# Patient Record
Sex: Male | Born: 1995 | Race: Black or African American | Hispanic: No | Marital: Single | Smoking: Current some day smoker
Health system: Southern US, Community
[De-identification: ages and names within clinical notes are randomized; demographics above are authoritative.]

## PROBLEM LIST (undated history)

## (undated) ENCOUNTER — Ambulatory Visit: Source: Home / Self Care

## (undated) DIAGNOSIS — T7840XA Allergy, unspecified, initial encounter: Secondary | ICD-10-CM

---
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
## 2016-02-02 ENCOUNTER — Other Ambulatory Visit (HOSPITAL_COMMUNITY)
Admission: RE | Admit: 2016-02-02 | Discharge: 2016-02-02 | Disposition: A | Discharge status: Home / Self Care | Payer: Medicaid Other | Source: Ambulatory Visit | Attending: Family Medicine | Admitting: Family Medicine

## 2016-02-02 ENCOUNTER — Encounter: Payer: Self-pay | Admitting: Family Medicine

## 2016-02-02 ENCOUNTER — Ambulatory Visit (INDEPENDENT_AMBULATORY_CARE_PROVIDER_SITE_OTHER): Payer: Medicaid Other | Admitting: Family Medicine

## 2016-02-02 VITALS — BP 108/56 | HR 60 | Temp 98.2°F | Wt 185.9 lb

## 2016-02-02 DIAGNOSIS — Z202 Contact with and (suspected) exposure to infections with a predominantly sexual mode of transmission: Secondary | ICD-10-CM | POA: Diagnosis not present

## 2016-02-02 DIAGNOSIS — R300 Dysuria: Secondary | ICD-10-CM

## 2016-02-02 DIAGNOSIS — Z113 Encounter for screening for infections with a predominantly sexual mode of transmission: Secondary | ICD-10-CM | POA: Diagnosis present

## 2016-02-02 LAB — POCT URINALYSIS DIPSTICK
BILIRUBIN UA: NEGATIVE
Blood, UA: NEGATIVE
Glucose, UA: NEGATIVE
Ketones, UA: NEGATIVE
Leukocytes, UA: NEGATIVE
Nitrite, UA: NEGATIVE
PROTEIN UA: NEGATIVE
Spec Grav, UA: 1.02
Urobilinogen, UA: 0.2
pH, UA: 7

## 2016-02-02 NOTE — Progress Notes (Signed)
    Subjective   Henry Blanchard is a 20 y.o. male that presents for a same day visit  1. Dysuria: Symptoms started for a few months. Dysuria is intermittent. No associated discharge, nausea or vomiting. He has no associated frequency but does have some hesitancy. He has noticed some bumps on the pubic area that are intermittent. He is sexually active with women only. He has one sexual relationship in the last year. He uses condoms some times.  ROS Per HPI  Social History  Substance Use Topics  . Smoking status: Never Smoker   . Smokeless tobacco: Not on file  . Alcohol Use: Not on file    No Known Allergies  Objective   BP 108/56 mmHg  Pulse 60  Temp(Src) 98.2 F (36.8 C) (Oral)  Wt 185 lb 14.4 oz (84.324 kg)  General: Well appearing, no distress Genitourinary: Penis appears normal with no lesions. Urethral meatus appears normal without discharge. Circumcised. He has a scarred lesion on his pubic area that is non-erythematous and non-tender  Assessment and Plan   1. Dysuria Possible UTI vs GC/Chlamydia. No anatomical reason found for dysuria. - Urine cytology ancillary only - POCT urinalysis dipstick  2. Possible exposure to STD - HIV antibody - Urine cytology ancillary only - POCT urinalysis dipstick

## 2016-02-02 NOTE — Patient Instructions (Signed)
Thank you for coming to see me today. It was a pleasure. Today we talked about:   Painful urination: I will check to make sure you don't have an infection. Please expect a call regarding your results. I advise you to sign up for MyChart  If you have any questions or concerns, please do not hesitate to call the office at [phone].  Sincerely,  Jacquelin Hawkingalph Lincoln Kleiner, MD

## 2016-02-03 LAB — HIV ANTIBODY (ROUTINE TESTING W REFLEX): HIV: NONREACTIVE

## 2016-02-03 LAB — URINE CYTOLOGY ANCILLARY ONLY
Chlamydia: NEGATIVE
Neisseria Gonorrhea: NEGATIVE

## 2016-02-04 ENCOUNTER — Telehealth: Payer: Self-pay | Admitting: *Deleted

## 2016-02-04 NOTE — Telephone Encounter (Signed)
-----   Message from Narda Bondsalph A Nettey, MD sent at 02/04/2016  8:25 AM EDT ----- HIV, Gonorrhea, Chlamydia and urinalysis negative for infection. Please inform patient. If patient continues to have symptoms, or if things change, please have him follow-up.

## 2016-02-04 NOTE — Telephone Encounter (Signed)
PT informed. Zimmerman Rumple, Yariana Hoaglund D, CMA  

## 2016-02-05 ENCOUNTER — Encounter: Payer: Self-pay | Admitting: Family Medicine

## 2016-02-10 MED ORDER — METRONIDAZOLE 500 MG PO TABS: 2000.0000 mg | ORAL_TABLET | Freq: Once | ORAL | Status: DC

## 2016-02-10 NOTE — Telephone Encounter (Signed)
Physician Telephone Note  Reason for call: MyChart message  Discussion with patient: Patient requesting HSV 1/2 and trichomonas testing. Decided to call patient to discuss this. Patient states he has was told by someone he had sex with that she had a trichomonas infection. Patient also states he has noticed bumps in his genital area. Recommended patient schedule an appointment with a provider to evaluate his bumps. Sent prescription for metronidazole to pharmacy as listed below. Advised that if he has had sex with anyone else since being exposed, that he should inform them to get treated.  Meds ordered this encounter  Medications  . metroNIDAZOLE (FLAGYL) 500 MG tablet    Sig: Take 4 tablets (2,000 mg total) by mouth once.    Dispense:  4 tablet    Refill:  0    Jacquelin Hawkingalph Annalea Alguire, MD PGY-3, [facility] 02/10/2016, 7:04 PM

## 2016-02-12 ENCOUNTER — Encounter: Payer: Self-pay | Admitting: Family Medicine

## 2016-03-01 ENCOUNTER — Ambulatory Visit (INDEPENDENT_AMBULATORY_CARE_PROVIDER_SITE_OTHER): Payer: Medicaid Other | Admitting: Family Medicine

## 2016-03-01 ENCOUNTER — Encounter: Payer: Self-pay | Admitting: Family Medicine

## 2016-03-01 VITALS — BP 102/54 | HR 55 | Temp 98.1°F | Ht 67.0 in | Wt 186.4 lb

## 2016-03-01 DIAGNOSIS — R21 Rash and other nonspecific skin eruption: Secondary | ICD-10-CM | POA: Diagnosis present

## 2016-03-01 NOTE — Progress Notes (Signed)
   Subjective:    Patient ID: Henry Blanchard, male    DOB: December 02, 1995, 20 y.o.   MRN: [id number]  HPI Questions about genital herpes. His recent sex partner has history of genital herpes. He's concerned that he may have that as he's noticed a few bumps on his scrotum and one on his leg. He's had no systemic symptoms specifically no fever or chills. He's had no outbreak of vesicles. He's had no penile pain. No urinary frequency, no urinary burning. He just had STD testing done here.   Review of Systems See history of present illness    Objective:   Physical Exam Vital signs reviewed GEN.: Well-developed male no acute distress SKIN: Genital skin reveals no lesions. He has one follicular lesion on the left upper thigh. It looks like a healing infected hair follicle. Penis is without any drainage, skin normal.       Assessment & Plan:  Concern about STDs. I explained herpetic infection to him and reassured him. Should she break out in multiple lesions he will return.

## 2016-03-06 ENCOUNTER — Encounter (HOSPITAL_COMMUNITY): Payer: Self-pay | Admitting: *Deleted

## 2016-03-06 ENCOUNTER — Ambulatory Visit (HOSPITAL_COMMUNITY)
Admission: EM | Admit: 2016-03-06 | Discharge: 2016-03-06 | Disposition: A | Discharge status: Home / Self Care | Payer: Medicaid Other | Attending: Family Medicine | Admitting: Family Medicine

## 2016-03-06 DIAGNOSIS — H578 Other specified disorders of eye and adnexa: Secondary | ICD-10-CM | POA: Diagnosis not present

## 2016-03-06 DIAGNOSIS — H5789 Other specified disorders of eye and adnexa: Secondary | ICD-10-CM

## 2016-03-06 MED ORDER — EYE WASH OPHTH SOLN
OPHTHALMIC | Status: AC
  Filled 2016-03-06: qty 118

## 2016-03-06 MED ORDER — TOBRAMYCIN 0.3 % OP OINT
TOPICAL_OINTMENT | Freq: Three times a day (TID) | OPHTHALMIC | Status: DC
  Administered 2016-03-06: 14:00:00 via OPHTHALMIC

## 2016-03-06 MED ORDER — TOBRAMYCIN 0.3 % OP OINT
TOPICAL_OINTMENT | OPHTHALMIC | Status: AC
  Filled 2016-03-06: qty 3.5

## 2016-03-06 NOTE — ED Provider Notes (Signed)
CSN: [id number]     Arrival date & time 03/06/16  1308 History   First MD Initiated Contact with Patient 03/06/16 1356     Chief Complaint  Patient presents with  . Eye Problem   (Consider location/radiation/quality/duration/timing/severity/associated sxs/prior Treatment) Patient is a 20 y.o. male presenting with eye problem. The history is provided by the patient.  Eye Problem Location:  R eye Quality:  Foreign body sensation Severity:  Mild Onset quality:  Gradual Duration:  12 hours Progression:  Unchanged Chronicity:  New Context: foreign body   Foreign body:  Unknown Relieved by:  None tried Worsened by:  Nothing tried Ineffective treatments:  None tried Associated symptoms: foreign body sensation   Associated symptoms: no blurred vision, no crusting, no decreased vision, no discharge, no double vision, no photophobia and no redness     History reviewed. No pertinent past medical history. History reviewed. No pertinent past surgical history. History reviewed. No pertinent family history. Social History  Substance Use Topics  . Smoking status: Never Smoker   . Smokeless tobacco: None  . Alcohol Use: None    Review of Systems  Constitutional: Negative.   HENT: Negative.   Eyes: Negative for blurred vision, double vision, photophobia, discharge and redness.  All other systems reviewed and are negative.   Allergies  Review of patient's allergies indicates no known allergies.  Home Medications   Prior to Admission medications   Medication Sig Start Date End Date Taking? Authorizing Provider  fluticasone (FLONASE) 50 MCG/ACT nasal spray Place 2 sprays into the nose daily. 10/22/12   Barbaraann Barthel, MD  guaiFENesin (MUCINEX) 600 MG 12 hr tablet Take 2 tablets (1,200 mg total) by mouth 2 (two) times daily. 10/22/12   Barbaraann Barthel, MD  metroNIDAZOLE (FLAGYL) 500 MG tablet Take 4 tablets (2,000 mg total) by mouth once. 02/10/16   Narda Bonds, MD  sodium chloride (OCEAN)  0.65 % nasal spray Place 1 spray into the nose as needed for congestion. 10/22/12   Barbaraann Barthel, MD   Meds Ordered and Administered this Visit   Medications  tobramycin (TOBREX) 0.3 % ophthalmic ointment (not administered)    BP 109/65 mmHg  Pulse 48  Temp(Src) 98.6 F (37 C) (Oral)  Resp 12  SpO2 100% No data found.   Physical Exam  Constitutional: He is oriented to person, place, and time. He appears well-developed and well-nourished. He appears distressed.  Eyes: Conjunctivae, EOM and lids are normal. Pupils are equal, round, and reactive to light. Lids are everted and swept, no foreign bodies found. Right eye exhibits no discharge. No foreign body present in the right eye. Left eye exhibits no discharge. No foreign body present in the left eye. Right conjunctiva is not injected. Left conjunctiva is not injected.  Slit lamp exam:      The right eye shows no corneal abrasion, no foreign body, no fluorescein uptake and no anterior chamber bulge.  Neck: Normal range of motion. Neck supple.  Neurological: He is alert and oriented to person, place, and time.  Skin: Skin is warm and dry.  Nursing note and vitals reviewed.   ED Course  Procedures (including critical care time)  Labs Review Labs Reviewed - No data to display  Imaging Review No results found.   Visual Acuity Review  Right Eye Distance:   Left Eye Distance:   Bilateral Distance:    Right Eye Near:   Left Eye Near:    Bilateral Near:  MDM   1. Irritation of right eye    Eye copiously irrigated with eye wash sol.    Linna HoffJames D Dariann Huckaba, MD 03/06/16 1420

## 2016-03-06 NOTE — Discharge Instructions (Signed)
Use medicine for 3-5 days, see eye doctor if problems.

## 2016-03-06 NOTE — ED Notes (Signed)
Pt  Reports  Feels like a  [place]  [place]  [place]air    In  r  Eye that  He  Noticed  Last  Pm

## 2016-04-25 ENCOUNTER — Other Ambulatory Visit (HOSPITAL_COMMUNITY)
Admission: RE | Admit: 2016-04-25 | Discharge: 2016-04-25 | Disposition: A | Discharge status: Home / Self Care | Payer: Medicaid Other | Source: Ambulatory Visit | Attending: Family Medicine | Admitting: Family Medicine

## 2016-04-25 ENCOUNTER — Encounter: Payer: Self-pay | Admitting: Internal Medicine

## 2016-04-25 ENCOUNTER — Ambulatory Visit (INDEPENDENT_AMBULATORY_CARE_PROVIDER_SITE_OTHER): Payer: Medicaid Other | Admitting: Internal Medicine

## 2016-04-25 VITALS — BP 109/75 | HR 52 | Temp 98.3°F | Wt 193.0 lb

## 2016-04-25 DIAGNOSIS — Z113 Encounter for screening for infections with a predominantly sexual mode of transmission: Secondary | ICD-10-CM | POA: Insufficient documentation

## 2016-04-25 DIAGNOSIS — Z202 Contact with and (suspected) exposure to infections with a predominantly sexual mode of transmission: Secondary | ICD-10-CM

## 2016-04-25 MED ORDER — AZITHROMYCIN 500 MG PO TABS
1000.0000 mg | ORAL_TABLET | Freq: Once | ORAL | Status: AC
  Administered 2016-04-25: 1000 mg via ORAL

## 2016-04-25 NOTE — Progress Notes (Signed)
   [facility] Phone: [phone]  Subjective:  STD check: Pt presents for STD check. Girlfriend tested positive for Chlamydia last week. She was just treated yesterday. They have not had sexual intercourse since she was treated. No dysuria. No lesions on his penis. No fevers, no chills. He endorses mild chronic stomach aches. He states he has only had 1 sexual partner in the last year. He was tested for gonorrhea, chlamydia, and HIV in 01/2016, all of which were negative.  ROS: See HPI for pertinent positives and negatives Past Medical History- none Reviewed problem list.  Medications- reviewed and updated Current Outpatient Prescriptions  Medication Sig Dispense Refill  . fluticasone (FLONASE) 50 MCG/ACT nasal spray Place 2 sprays into the nose daily. 16 g 6  . guaiFENesin (MUCINEX) 600 MG 12 hr tablet Take 2 tablets (1,200 mg total) by mouth 2 (two) times daily. 20 tablet 4  . metroNIDAZOLE (FLAGYL) 500 MG tablet Take 4 tablets (2,000 mg total) by mouth once. 4 tablet 0  . sodium chloride (OCEAN) 0.65 % nasal spray Place 1 spray into the nose as needed for congestion. 15 mL 12   No current facility-administered medications for this visit.   Chief complaint-noted Family history reviewed for today's visit. No changes. Social history- patient is a never smoker. Smokes marijuana.  Objective: BP 109/75 mmHg  Pulse 52  Temp(Src) 98.3 F (36.8 C) (Oral)  Wt 193 lb (87.544 kg) Gen: NAD, alert, cooperative with exam HEENT: NCAT, EOMI, MMM Neck: FROM, supple CV: RRR, no murmur Resp: CTABL, no wheezes, normal work of breathing GI: SNTND, BS present, no guarding or organomegaly Msk: No edema, warm, normal tone, moves UE/LE spontaneously Neuro: Alert and oriented, no gross deficits Skin: No rashes, no lesions Psych: Appropriate behavior  Assessment/Plan: Exposure to STD: Pt's girlfriend tested positive for Chlamydia last week and was just treated yesterday.  They have not had sexual intercourse since she was treated. He denies any symptoms, but given his exposure, will treat in clinic today. - Azithromycin 1g PO x 1 given in clinic - Urine GC/Chlamydia, HIV, RPR - Follow-up as needed   Henry [name] Jalena Vanderlinden, MD PGY-2

## 2016-04-25 NOTE — Assessment & Plan Note (Signed)
Pt's girlfriend tested positive for Chlamydia last week and was just treated yesterday. They have not had sexual intercourse since she was treated. He denies any symptoms, but given his exposure, will treat in clinic today. - Azithromycin 1g PO x 1 given in clinic - Urine GC/Chlamydia, HIV, RPR - Follow-up as needed

## 2016-04-25 NOTE — Patient Instructions (Signed)
It was so nice to meet you!  I will call you with the results of your labs. We have treated you with Azithromycin in clinic today.  - Dr. Nancy MarusMayo

## 2016-04-26 LAB — HIV ANTIBODY (ROUTINE TESTING W REFLEX): HIV 1&2 Ab, 4th Generation: NONREACTIVE

## 2016-04-26 LAB — RPR

## 2016-04-27 LAB — URINE CYTOLOGY ANCILLARY ONLY
Chlamydia: NEGATIVE
NEISSERIA GONORRHEA: NEGATIVE

## 2016-04-28 ENCOUNTER — Telehealth: Payer: Self-pay | Admitting: Internal Medicine

## 2016-04-28 NOTE — Telephone Encounter (Signed)
Spoke with patient over the phone regarding lab results. Pt voiced understanding.  Henry [name] [name], MD PGY-2

## 2016-05-12 ENCOUNTER — Emergency Department (HOSPITAL_COMMUNITY)
Admission: EM | Admit: 2016-05-12 | Discharge: 2016-05-13 | Disposition: A | Discharge status: Home / Self Care | Payer: Medicaid Other | Attending: Emergency Medicine | Admitting: Emergency Medicine

## 2016-05-12 ENCOUNTER — Encounter (HOSPITAL_COMMUNITY): Payer: Self-pay

## 2016-05-12 DIAGNOSIS — Z23 Encounter for immunization: Secondary | ICD-10-CM | POA: Diagnosis not present

## 2016-05-12 DIAGNOSIS — Y92410 Unspecified street and highway as the place of occurrence of the external cause: Secondary | ICD-10-CM | POA: Insufficient documentation

## 2016-05-12 DIAGNOSIS — IMO0002 Reserved for concepts with insufficient information to code with codable children: Secondary | ICD-10-CM

## 2016-05-12 DIAGNOSIS — Y939 Activity, unspecified: Secondary | ICD-10-CM | POA: Diagnosis not present

## 2016-05-12 DIAGNOSIS — S161XXA Strain of muscle, fascia and tendon at neck level, initial encounter: Secondary | ICD-10-CM | POA: Insufficient documentation

## 2016-05-12 DIAGNOSIS — Y999 Unspecified external cause status: Secondary | ICD-10-CM | POA: Diagnosis not present

## 2016-05-12 DIAGNOSIS — Z79899 Other long term (current) drug therapy: Secondary | ICD-10-CM | POA: Diagnosis not present

## 2016-05-12 DIAGNOSIS — S51812A Laceration without foreign body of left forearm, initial encounter: Secondary | ICD-10-CM | POA: Diagnosis not present

## 2016-05-12 NOTE — ED Notes (Signed)
PA at bedside.
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
## 2016-05-12 NOTE — ED Notes (Signed)
Pt able to ambulate independently 
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
## 2016-05-13 ENCOUNTER — Emergency Department (HOSPITAL_COMMUNITY): Payer: Medicaid Other

## 2016-05-13 MED ORDER — LIDOCAINE HCL (PF) 1 % IJ SOLN
INTRAMUSCULAR | Status: AC
  Administered 2016-05-13: 30 mL
  Filled 2016-05-13: qty 30

## 2016-05-13 MED ORDER — NAPROXEN 250 MG PO TABS
500.0000 mg | ORAL_TABLET | Freq: Once | ORAL | Status: AC
  Administered 2016-05-13: 500 mg via ORAL
  Filled 2016-05-13: qty 2

## 2016-05-13 MED ORDER — CYCLOBENZAPRINE HCL 10 MG PO TABS: 10.0000 mg | ORAL_TABLET | Freq: Two times a day (BID) | ORAL | 0 refills | Status: DC | PRN

## 2016-05-13 MED ORDER — NAPROXEN 500 MG PO TABS: 500.0000 mg | ORAL_TABLET | Freq: Two times a day (BID) | ORAL | 0 refills | Status: DC

## 2016-05-13 MED ORDER — CYCLOBENZAPRINE HCL 10 MG PO TABS
5.0000 mg | ORAL_TABLET | Freq: Once | ORAL | Status: AC
  Administered 2016-05-13: 5 mg via ORAL
  Filled 2016-05-13: qty 1

## 2016-05-13 MED ORDER — TETANUS-DIPHTH-ACELL PERTUSSIS 5-2.5-18.5 LF-MCG/0.5 IM SUSP
0.5000 mL | Freq: Once | INTRAMUSCULAR | Status: AC
  Administered 2016-05-13: 0.5 mL via INTRAMUSCULAR
  Filled 2016-05-13: qty 0.5

## 2016-05-13 NOTE — ED Notes (Signed)
Pt transported to xray 

## 2016-05-13 NOTE — ED Notes (Signed)
Patient able to ambulate independently  

## 2016-05-13 NOTE — Discharge Instructions (Signed)
Return to this department in 10-14 days for suture removal.

## 2016-05-20 ENCOUNTER — Emergency Department (HOSPITAL_COMMUNITY)
Admission: EM | Admit: 2016-05-20 | Discharge: 2016-05-20 | Disposition: A | Discharge status: Home / Self Care | Payer: Medicaid Other | Attending: Emergency Medicine | Admitting: Emergency Medicine

## 2016-05-20 ENCOUNTER — Encounter (HOSPITAL_COMMUNITY): Payer: Self-pay

## 2016-05-20 DIAGNOSIS — Z4802 Encounter for removal of sutures: Secondary | ICD-10-CM | POA: Diagnosis not present

## 2016-05-20 NOTE — ED Notes (Signed)
Declined W/C at D/C and was escorted to lobby by RN. 

## 2016-05-20 NOTE — ED Provider Notes (Signed)
[facility] Provider Note   CSN: [id number] Arrival date & time: 05/20/16  1801  First Provider Contact:   First MD Initiated Contact with Patient 05/20/16 1820     By signing my name below, I, Nelwyn Salisbury, attest that this documentation has been prepared under the direction and in the presence of non-physician practitioner, Fayrene Helper, PA-C Electronically Signed: Nelwyn Salisbury, Scribe. 05/20/2016. 6:20 PM.   History   Chief Complaint Chief Complaint  Patient presents with  . Suture / Staple Removal   The history is provided by the patient. No language interpreter was used.   HPI Comments:  Henry Blanchard is a 20 y.o. male who presents to the Emergency Department for a left forearm suture removal. Pt reports associated itchiness. The sutures were placed 8 days ago. Pt is denies pain and drainage.  History reviewed. No pertinent past medical history.  Patient Active Problem List   Diagnosis Date Noted  . Possible exposure to STD 04/25/2016  . Otitis, externa, infective 01/09/2014  . Upper respiratory infection 10/23/2012  . RIB PAIN 06/28/2009  . OVERWEIGHT 05/12/2009    History reviewed. No pertinent surgical history.     Home Medications    Prior to Admission medications   Medication Sig Start Date End Date Taking? Authorizing Provider  cyclobenzaprine (FLEXERIL) 10 MG tablet Take 1 tablet (10 mg total) by mouth 2 (two) times daily as needed for muscle spasms. 05/13/16   Cheri Fowler, PA-C  fluticasone (FLONASE) 50 MCG/ACT nasal spray Place 2 sprays into the nose daily. 10/22/12   Barbaraann Barthel, MD  guaiFENesin (MUCINEX) 600 MG 12 hr tablet Take 2 tablets (1,200 mg total) by mouth 2 (two) times daily. 10/22/12   Barbaraann Barthel, MD  metroNIDAZOLE (FLAGYL) 500 MG tablet Take 4 tablets (2,000 mg total) by mouth once. 02/10/16   Narda Bonds, MD  naproxen (NAPROSYN) 500 MG tablet Take 1 tablet (500 mg total) by mouth 2 (two) times daily. 05/13/16   Cheri Fowler, PA-C    sodium chloride (OCEAN) 0.65 % nasal spray Place 1 spray into the nose as needed for congestion. 10/22/12   Barbaraann Barthel, MD    Family History No family history on file.  Social History Social History  Substance Use Topics  . Smoking status: Never Smoker  . Smokeless tobacco: Never Used  . Alcohol use No     Allergies   Review of patient's allergies indicates no known allergies.   Review of Systems Review of Systems  Constitutional: Negative for fever.  Skin: Positive for wound.     Physical Exam Updated Vital Signs BP 118/67 (BP Location: Right Arm)   Pulse 84   Temp 98.5 F (36.9 C) (Oral)   Resp 16   SpO2 94%   Physical Exam  Constitutional: He is oriented to person, place, and time. He appears well-developed and well-nourished. No distress.  HENT:  Head: Normocephalic and atraumatic.  Eyes: Conjunctivae are normal.  Cardiovascular: Normal rate.   Pulmonary/Chest: Effort normal.  Abdominal: He exhibits no distension.  Neurological: He is alert and oriented to person, place, and time.  Skin: Skin is warm and dry.  Well healing laceration to distal left forearm with sutures in place. Nontender to palpation.  Psychiatric: He has a normal mood and affect.  Nursing note and vitals reviewed.   ED Treatments / Results  DIAGNOSTIC STUDIES:  Oxygen Saturation is 94% on RA, adequate by my interpretation.    COORDINATION OF CARE:  6:20 PM Discussed treatment plan with pt at bedside which included application of moisteurizer and pt agreed to plan.  Labs (all labs ordered are listed, but only abnormal results are displayed) Labs Reviewed - No data to display  EKG  EKG Interpretation None       Radiology No results found.  Procedures .Suture Removal Date/Time: 05/20/2016 6:22 PM Performed by: Fayrene HelperRAN, Daisie Haft Authorized by: Fayrene HelperRAN, Cyndy Braver   Consent:    Consent obtained:  Verbal   Consent given by:  Patient Location:    Location:  Upper extremity    Upper extremity location:  Arm   Arm location:  L lower arm Procedure details:    Wound appearance:  No signs of infection   Number of sutures removed:  7 Post-procedure details:    Patient tolerance of procedure:  Tolerated well, no immediate complications   (including critical care time)  Medications Ordered in ED Medications - No data to display   Initial Impression / Assessment and Plan / ED Course  I have reviewed the triage vital signs and the nursing notes.  Pertinent labs & imaging results that were available during my care of the patient were reviewed by me and considered in my medical decision making (see chart for details).  Clinical Course    BP 118/67 (BP Location: Right Arm)   Pulse 84   Temp 98.5 F (36.9 C) (Oral)   Resp 16   SpO2 94%   Staple removal   Pt to ER for staple/suture removal and wound check as above. Procedure tolerated well. Vitals normal, no signs of infection. Scar minimization & return precautions given at dc.   Final Clinical Impressions(s) / ED Diagnoses   Final diagnoses:  Visit for suture removal    New Prescriptions New Prescriptions   No medications on file   I personally performed the services described in this documentation, which was scribed in my presence. The recorded information has been reviewed and is accurate.       Fayrene HelperBowie Skylor Hughson, PA-C 05/20/16 1828    Donnetta HutchingBrian Cook, MD 05/21/16 [phone]

## 2016-05-20 NOTE — ED Triage Notes (Signed)
Patient here to have sutures removed from left arm, no complaints. In place x 1 week

## 2017-03-09 ENCOUNTER — Encounter (HOSPITAL_COMMUNITY): Payer: Self-pay

## 2017-03-09 ENCOUNTER — Emergency Department (HOSPITAL_COMMUNITY)
Admission: EM | Admit: 2017-03-09 | Discharge: 2017-03-09 | Disposition: A | Discharge status: Home / Self Care | Payer: Medicaid Other | Attending: Emergency Medicine | Admitting: Emergency Medicine

## 2017-03-09 DIAGNOSIS — Z79899 Other long term (current) drug therapy: Secondary | ICD-10-CM | POA: Insufficient documentation

## 2017-03-09 DIAGNOSIS — N342 Other urethritis: Secondary | ICD-10-CM | POA: Insufficient documentation

## 2017-03-09 DIAGNOSIS — R300 Dysuria: Secondary | ICD-10-CM | POA: Diagnosis present

## 2017-03-09 LAB — URINALYSIS, ROUTINE W REFLEX MICROSCOPIC
BILIRUBIN URINE: NEGATIVE
Bacteria, UA: NONE SEEN
GLUCOSE, UA: NEGATIVE mg/dL
Hgb urine dipstick: NEGATIVE
KETONES UR: NEGATIVE mg/dL
LEUKOCYTES UA: NEGATIVE
Nitrite: NEGATIVE
PH: 5 (ref 5.0–8.0)
PROTEIN: 30 mg/dL — AB
Specific Gravity, Urine: 1.03 (ref 1.005–1.030)

## 2017-03-09 LAB — GC/CHLAMYDIA PROBE AMP (~~LOC~~) NOT AT ARMC
Chlamydia: NEGATIVE
Neisseria Gonorrhea: NEGATIVE

## 2017-03-09 MED ORDER — CEFTRIAXONE SODIUM 250 MG IJ SOLR
250.0000 mg | Freq: Once | INTRAMUSCULAR | Status: AC
  Administered 2017-03-09: 250 mg via INTRAMUSCULAR
  Filled 2017-03-09: qty 250

## 2017-03-09 MED ORDER — LIDOCAINE HCL (PF) 1 % IJ SOLN
INTRAMUSCULAR | Status: AC
  Filled 2017-03-09: qty 5

## 2017-03-09 MED ORDER — DOXYCYCLINE HYCLATE 100 MG PO CAPS: 100.0000 mg | ORAL_CAPSULE | Freq: Two times a day (BID) | ORAL | 0 refills | Status: DC

## 2017-03-09 NOTE — ED Provider Notes (Signed)
[facility] Provider Note   CSN: [id number] Arrival date & time: 03/09/17  0309  By signing my name below, I, Henry Blanchard, attest that this documentation has been prepared under the direction and in the presence of Karthikeya Funke, Canary Brimhristopher J, MD. Electronically Signed: Elder Negusussell Blanchard, Scribe. 03/09/17. 3:47 AM.   History   Chief Complaint Chief Complaint  Patient presents with  . Dysuria    HPI Henry Blanchard is a 21 y.o. male without chronic medical problems who presents to the ED with urinary complaints. This patient states that for the last 2 weeks he has experienced urinary frequency and "pressure" whenever he urinates; "like I have trouble getting it out". He is sexually active with new partners. Never experienced these symptoms before. No testicular pain or swelling. No penile discharge. No lesions or rashes. Denies any history of diabetes.   The history is provided by the patient. No language interpreter was used.  Dysuria   This is a new problem. The current episode started more than 1 week ago. The problem occurs intermittently. The problem has not changed since onset.The pain is mild. There has been no fever. He is sexually active. Associated symptoms include frequency.    History reviewed. No pertinent past medical history.  Patient Active Problem List   Diagnosis Date Noted  . Possible exposure to STD 04/25/2016  . Otitis, externa, infective 01/09/2014  . Upper respiratory infection 10/23/2012  . RIB PAIN 06/28/2009  . OVERWEIGHT 05/12/2009    History reviewed. No pertinent surgical history.     Home Medications    Prior to Admission medications   Medication Sig Start Date End Date Taking? Authorizing Provider  cyclobenzaprine (FLEXERIL) 10 MG tablet Take 1 tablet (10 mg total) by mouth 2 (two) times daily as needed for muscle spasms. 05/13/16   Cheri Fowlerose, Kayla, PA-C  doxycycline (VIBRAMYCIN) 100 MG capsule Take 1 capsule (100 mg total) by mouth 2 (two)  times daily. 03/09/17   Gilda CreasePollina, Breiona Couvillon J, MD  fluticasone (FLONASE) 50 MCG/ACT nasal spray Place 2 sprays into the nose daily. 10/22/12   Barbaraann BarthelBreen, James O, MD  guaiFENesin (MUCINEX) 600 MG 12 hr tablet Take 2 tablets (1,200 mg total) by mouth 2 (two) times daily. 10/22/12   Barbaraann BarthelBreen, James O, MD  metroNIDAZOLE (FLAGYL) 500 MG tablet Take 4 tablets (2,000 mg total) by mouth once. 02/10/16   Narda BondsNettey, Ralph A, MD  naproxen (NAPROSYN) 500 MG tablet Take 1 tablet (500 mg total) by mouth 2 (two) times daily. 05/13/16   Cheri Fowlerose, Kayla, PA-C  sodium chloride (OCEAN) 0.65 % nasal spray Place 1 spray into the nose as needed for congestion. 10/22/12   Barbaraann BarthelBreen, James O, MD    Family History No family history on file.  Social History Social History  Substance Use Topics  . Smoking status: Never Smoker  . Smokeless tobacco: Never Used  . Alcohol use No     Allergies   Patient has no known allergies.   Review of Systems Review of Systems  Endocrine: Positive for polydipsia.  Genitourinary: Positive for dysuria and frequency. Negative for penile pain, penile swelling, scrotal swelling and testicular pain.  Skin: Negative for rash.  All other systems reviewed and are negative.    Physical Exam Updated Vital Signs BP 112/69   Pulse 68   Temp 98.5 F (36.9 C) (Oral)   Resp 18   SpO2 95%   Physical Exam  Constitutional: He is oriented to person, place, and time. He appears well-developed and  well-nourished. No distress.  HENT:  Head: Normocephalic and atraumatic.  Right Ear: Hearing normal.  Left Ear: Hearing normal.  Nose: Nose normal.  Mouth/Throat: Oropharynx is clear and moist and mucous membranes are normal.  Eyes: Conjunctivae and EOM are normal. Pupils are equal, round, and reactive to light.  Neck: Normal range of motion. Neck supple.  Cardiovascular: Regular rhythm, S1 normal and S2 normal.  Exam reveals no gallop and no friction rub.   No murmur heard. Pulmonary/Chest: Effort normal and  breath sounds normal. No respiratory distress. He exhibits no tenderness.  Abdominal: Soft. Normal appearance and bowel sounds are normal. There is no hepatosplenomegaly. There is no tenderness. There is no rebound, no guarding, no tenderness at McBurney's point and negative Murphy's sign. No hernia.  Musculoskeletal: Normal range of motion.  Neurological: He is alert and oriented to person, place, and time. He has normal strength. No cranial nerve deficit or sensory deficit. Coordination normal. GCS eye subscore is 4. GCS verbal subscore is 5. GCS motor subscore is 6.  Skin: Skin is warm, dry and intact. No rash noted. No cyanosis.  Psychiatric: He has a normal mood and affect. His speech is normal and behavior is normal. Thought content normal.  Nursing note and vitals reviewed.    ED Treatments / Results  Labs (all labs ordered are listed, but only abnormal results are displayed) Labs Reviewed  URINALYSIS, ROUTINE W REFLEX MICROSCOPIC - Abnormal; Notable for the following:       Result Value   Protein, ur 30 (*)    Squamous Epithelial / LPF 0-5 (*)    All other components within normal limits  GC/CHLAMYDIA PROBE AMP ([place]) NOT AT [facility]    EKG  EKG Interpretation None       Radiology No results found.  Procedures Procedures (including critical care time)  Medications Ordered in ED Medications  cefTRIAXone (ROCEPHIN) injection 250 mg (not administered)     Initial Impression / Assessment and Plan / ED Course  I have reviewed the triage vital signs and the nursing notes.  Pertinent labs & imaging results that were available during my care of the patient were reviewed by me and considered in my medical decision making (see chart for details).     Patient presents to the emergency department for evaluation of dysuria. Patient does report urinary frequency as well. He reports unprotected sex, concern for possible STD. Examination was entirely unremarkable.  Urinalysis is normal. This included no evidence of glucose. Patient will be treated empirically for urethritis.  Final Clinical Impressions(s) / ED Diagnoses   Final diagnoses:  Urethritis    New Prescriptions New Prescriptions   DOXYCYCLINE (VIBRAMYCIN) 100 MG CAPSULE    Take 1 capsule (100 mg total) by mouth 2 (two) times daily.  I personally performed the services described in this documentation, which was scribed in my presence. The recorded information has been reviewed and is accurate.    Gilda Crease, MD 03/09/17 [phone]

## 2017-03-09 NOTE — ED Triage Notes (Signed)
Pt states that for the past two to three weeks he has been having dysuria and frequency. Denies discharge or fevers.
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
## 2018-05-28 ENCOUNTER — Encounter (HOSPITAL_COMMUNITY): Payer: Self-pay | Admitting: Emergency Medicine

## 2018-05-28 ENCOUNTER — Ambulatory Visit (HOSPITAL_COMMUNITY)
Admission: EM | Admit: 2018-05-28 | Discharge: 2018-05-28 | Disposition: A | Discharge status: Home / Self Care | Payer: Self-pay

## 2018-05-28 DIAGNOSIS — J351 Hypertrophy of tonsils: Secondary | ICD-10-CM

## 2018-05-28 DIAGNOSIS — J358 Other chronic diseases of tonsils and adenoids: Secondary | ICD-10-CM

## 2018-05-28 NOTE — ED Triage Notes (Signed)
Pt sts pain and swelling in tonsils x 1 months; denies fever

## 2018-05-28 NOTE — Discharge Instructions (Addendum)
It was nice meeting you!!  Unfortunately there really isn't much conservative treatment for tonsil stones.  Sometimes they resolve on their own.  If they become worse or lager you may want to follow up with an ENT physician.  Follow up as needed for continued or worsening symptoms

## 2018-05-28 NOTE — ED Provider Notes (Addendum)
[facility]    CSN: [id number] Arrival date & time: 05/28/18  1218     History   Chief Complaint Chief Complaint  Patient presents with  . Sore Throat    HPI Henry Blanchard is a 22 y.o. male.   She is a healthy 22 year old male presents with tonsillar swelling with stones x1 month.  The problem has been waxing and waning but worsening over the last week with more stones.  He reports he is attempted to remove some of them on his own.  Some pain with swallowing and pain radiating into his right ear.  No trouble swallowing or breathing.  No fever, chills, body aches, night sweats or fatigue.  He does not smoke. No hx of tonsil stones.   ROS per HPI      History reviewed. No pertinent past medical history.  Patient Active Problem List   Diagnosis Date Noted  . Possible exposure to STD 04/25/2016  . Otitis, externa, infective 01/09/2014  . Upper respiratory infection 10/23/2012  . RIB PAIN 06/28/2009  . OVERWEIGHT 05/12/2009    History reviewed. No pertinent surgical history.     Home Medications    Prior to Admission medications   Medication Sig Start Date End Date Taking? Authorizing Provider  cyclobenzaprine (FLEXERIL) 10 MG tablet Take 1 tablet (10 mg total) by mouth 2 (two) times daily as needed for muscle spasms. Patient not taking: Reported on 05/28/2018 05/13/16   Cheri Fowlerose, Kayla, PA-C  doxycycline (VIBRAMYCIN) 100 MG capsule Take 1 capsule (100 mg total) by mouth 2 (two) times daily. Patient not taking: Reported on 05/28/2018 03/09/17   Gilda CreasePollina, Christopher J, MD  fluticasone [facility](FLONASE) 50 MCG/ACT nasal spray Place 2 sprays into the nose daily. 10/22/12   Barbaraann BarthelBreen, James O, MD  guaiFENesin (MUCINEX) 600 MG 12 hr tablet Take 2 tablets (1,200 mg total) by mouth 2 (two) times daily. 10/22/12   Barbaraann BarthelBreen, James O, MD  metroNIDAZOLE (FLAGYL) 500 MG tablet Take 4 tablets (2,000 mg total) by mouth once. Patient not taking: Reported on 05/28/2018 02/10/16   Narda BondsNettey, Ralph A,  MD  naproxen (NAPROSYN) 500 MG tablet Take 1 tablet (500 mg total) by mouth 2 (two) times daily. 05/13/16   Cheri Fowlerose, Kayla, PA-C  sodium chloride (OCEAN) 0.65 % nasal spray Place 1 spray into the nose as needed for congestion. 10/22/12   Barbaraann BarthelBreen, James O, MD    Family History History reviewed. No pertinent family history.  Social History Social History   Tobacco Use  . Smoking status: Never Smoker  . Smokeless tobacco: Never Used  Substance Use Topics  . Alcohol use: No  . Drug use: Not on file     Allergies   Patient has no known allergies.   Review of Systems Review of Systems   Physical Exam Triage Vital Signs ED Triage Vitals [05/28/18 1239]  Enc Vitals Group     BP 110/64     Pulse Rate (!) 55     Resp 15     Temp 97.9 F (36.6 C)     Temp Source Oral     SpO2 98 %     Weight      Height      Head Circumference      Peak Flow      Pain Score      Pain Loc      Pain Edu?      Excl. in GC?    No data found.  Updated  Vital Signs BP 110/64 (BP Location: Right Arm)   Pulse (!) 55   Temp 97.9 F (36.6 C) (Oral)   Resp 15   SpO2 98%   Visual Acuity Right Eye Distance:   Left Eye Distance:   Bilateral Distance:    Right Eye Near:   Left Eye Near:    Bilateral Near:     Physical Exam  Constitutional: He appears well-developed and well-nourished.  Non-toxic appearance. He does not appear ill.  HENT:  Head: Normocephalic and atraumatic.  Right Ear: Hearing, tympanic membrane and ear canal normal.  Left Ear: Hearing, tympanic membrane and ear canal normal.  Mouth/Throat: Uvula is midline and mucous membranes are normal.  1+ swelling to bilateral tonsils. Multiple stones noted in the right tonsil. Uvula midline. No odor to breath.   Eyes: Pupils are equal, round, and reactive to light.  Neck: Normal range of motion.  Pulmonary/Chest: Effort normal.  Lymphadenopathy:    He has no cervical adenopathy.  Neurological: He is alert.  Skin: Skin is warm and  dry.  Psychiatric: He has a normal mood and affect. His behavior is normal.  Nursing note and vitals reviewed.    UC Treatments / Results  Labs (all labs ordered are listed, but only abnormal results are displayed) Labs Reviewed - No data to display  EKG None  Radiology No results found.  Procedures Procedures (including critical care time)  Medications Ordered in UC Medications - No data to display  Initial Impression / Assessment and Plan / UC Course  I have reviewed the triage vital signs and the nursing notes.  Pertinent labs & imaging results that were available during my care of the patient were reviewed by me and considered in my medical decision making (see chart for details).      Tonsillolith- no hx of same.  Non toxic or ill appearing. No significant swelling or pain in the tonsils.  He will need to follow up with ENT for continued or worsening symptoms.  Pt agreeable to plan.  Final Clinical Impressions(s) / UC Diagnoses   Final diagnoses:  Tonsillolith     Discharge Instructions     It was nice meeting you!!  Unfortunately there really isn't much conservative treatment for tonsil stones.  Sometimes they resolve on their own.  If they become worse or lager you may want to follow up with an ENT physician.  Follow up as needed for continued or worsening symptoms     ED Prescriptions    None     Controlled Substance Prescriptions [place] Controlled Substance Registry consulted? Not Applicable   Janace ArisBast, Calysta Craigo A, NP 05/28/18 1638    Dahlia ByesBast, Kinsly Hild A, NP 05/28/18 [phone]

## 2018-11-13 ENCOUNTER — Emergency Department (HOSPITAL_COMMUNITY)
Admission: EM | Admit: 2018-11-13 | Discharge: 2018-11-14 | Disposition: A | Discharge status: Home / Self Care | Payer: No Typology Code available for payment source | Attending: Emergency Medicine | Admitting: Emergency Medicine

## 2018-11-13 DIAGNOSIS — M545 Low back pain: Secondary | ICD-10-CM | POA: Diagnosis not present

## 2018-11-13 DIAGNOSIS — M7918 Myalgia, other site: Secondary | ICD-10-CM | POA: Diagnosis not present

## 2018-11-13 DIAGNOSIS — Z79899 Other long term (current) drug therapy: Secondary | ICD-10-CM | POA: Insufficient documentation

## 2018-11-13 DIAGNOSIS — M542 Cervicalgia: Secondary | ICD-10-CM | POA: Diagnosis present

## 2018-11-13 MED ORDER — LIDOCAINE 5 % EX PTCH
1.0000 | MEDICATED_PATCH | CUTANEOUS | Status: DC
  Administered 2018-11-14: 1 via TRANSDERMAL
  Filled 2018-11-13: qty 1

## 2018-11-13 MED ORDER — NAPROXEN 250 MG PO TABS
500.0000 mg | ORAL_TABLET | Freq: Once | ORAL | Status: AC
  Administered 2018-11-13: 500 mg via ORAL
  Filled 2018-11-13: qty 2

## 2018-11-13 MED ORDER — METHOCARBAMOL 500 MG PO TABS: 500.0000 mg | ORAL_TABLET | Freq: Two times a day (BID) | ORAL | 0 refills | Status: DC | PRN

## 2018-11-13 MED ORDER — NAPROXEN 500 MG PO TABS: 500.0000 mg | ORAL_TABLET | Freq: Two times a day (BID) | ORAL | 0 refills | Status: DC | PRN
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
## 2018-11-13 NOTE — Discharge Instructions (Signed)
Alternate ice and heat to areas of injury 3-4 times per day to limit inflammation and spasm.  Avoid strenuous activity and heavy lifting.  We recommend consistent use of naproxen in addition to Robaxin for muscle spasms. Do not drive or drink alcohol after taking Robaxin as it may make you drowsy and impair your judgment.  We recommend follow-up with a primary care doctor to ensure resolution of symptoms.  Return to the ED for any new or concerning symptoms. 
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
## 2018-11-15 ENCOUNTER — Ambulatory Visit (HOSPITAL_COMMUNITY)
Admission: EM | Admit: 2018-11-15 | Discharge: 2018-11-15 | Discharge status: Against Medical Advice | Payer: No Typology Code available for payment source

## 2019-02-14 ENCOUNTER — Encounter (HOSPITAL_COMMUNITY): Payer: Self-pay

## 2019-02-14 ENCOUNTER — Other Ambulatory Visit: Payer: Self-pay

## 2019-02-14 ENCOUNTER — Emergency Department (HOSPITAL_COMMUNITY)
Admission: EM | Admit: 2019-02-14 | Discharge: 2019-02-14 | Disposition: A | Discharge status: Home / Self Care | Payer: Medicaid Other | Attending: Emergency Medicine | Admitting: Emergency Medicine

## 2019-02-14 DIAGNOSIS — L293 Anogenital pruritus, unspecified: Secondary | ICD-10-CM | POA: Insufficient documentation

## 2019-02-14 DIAGNOSIS — Z79899 Other long term (current) drug therapy: Secondary | ICD-10-CM | POA: Insufficient documentation

## 2019-02-14 DIAGNOSIS — Z202 Contact with and (suspected) exposure to infections with a predominantly sexual mode of transmission: Secondary | ICD-10-CM | POA: Insufficient documentation

## 2019-02-14 LAB — URINALYSIS, ROUTINE W REFLEX MICROSCOPIC
Bilirubin Urine: NEGATIVE
Glucose, UA: NEGATIVE mg/dL
Hgb urine dipstick: NEGATIVE
Ketones, ur: NEGATIVE mg/dL
Leukocytes,Ua: NEGATIVE
Nitrite: NEGATIVE
Protein, ur: NEGATIVE mg/dL
Specific Gravity, Urine: 1.025 (ref 1.005–1.030)
pH: 6 (ref 5.0–8.0)

## 2019-02-14 LAB — RAPID HIV SCREEN (HIV 1/2 AB+AG)
HIV 1/2 Antibodies: NONREACTIVE
HIV-1 P24 Antigen - HIV24: NONREACTIVE

## 2019-02-14 LAB — GC/CHLAMYDIA PROBE AMP (~~LOC~~) NOT AT ARMC
Chlamydia: NEGATIVE
Neisseria Gonorrhea: NEGATIVE

## 2019-02-14 LAB — RPR: RPR Ser Ql: NONREACTIVE

## 2019-02-14 MED ORDER — ONDANSETRON 4 MG PO TBDP
4.0000 mg | ORAL_TABLET | Freq: Once | ORAL | Status: AC
  Administered 2019-02-14: 4 mg via ORAL
  Filled 2019-02-14: qty 1

## 2019-02-14 MED ORDER — CEFTRIAXONE SODIUM 250 MG IJ SOLR
250.0000 mg | Freq: Once | INTRAMUSCULAR | Status: AC
  Administered 2019-02-14: 250 mg via INTRAMUSCULAR
  Filled 2019-02-14: qty 250

## 2019-02-14 MED ORDER — AZITHROMYCIN 1 G PO PACK
1.0000 g | PACK | Freq: Once | ORAL | Status: AC
Start: 2019-02-14 — End: 2019-02-14
  Administered 2019-02-14: 1 g via ORAL
  Filled 2019-02-14: qty 1

## 2019-02-14 MED ORDER — LIDOCAINE HCL (PF) 1 % IJ SOLN
INTRAMUSCULAR | Status: AC
  Administered 2019-02-14: 2.1 mL
  Filled 2019-02-14: qty 5

## 2019-02-14 MED ORDER — PENICILLIN G BENZATHINE 1200000 UNIT/2ML IM SUSP
2.4000 10*6.[IU] | Freq: Once | INTRAMUSCULAR | Status: AC
Start: 2019-02-14 — End: 2019-02-14
  Administered 2019-02-14: 2.4 10*6.[IU] via INTRAMUSCULAR
  Filled 2019-02-14: qty 4

## 2019-02-14 MED ORDER — PERMETHRIN 5 % EX CREA: TOPICAL_CREAM | CUTANEOUS | 0 refills | Status: DC

## 2019-02-14 NOTE — Discharge Instructions (Signed)

## 2019-02-14 NOTE — ED Triage Notes (Signed)
Pt states that his partner has tested positive for syphilis, pt only c/o of itching

## 2019-02-14 NOTE — ED Provider Notes (Signed)
[facility] Provider Note   CSN: [id number] Arrival date & time: 02/14/19  0133    History   Chief Complaint Chief Complaint  Patient presents with   Exposure to STD    HPI Henry Blanchard L Henry Blanchard is a 23 y.o. male.     HPI   Pt is a 23 y/o male who presents to the ED today for eval after possible exposure to STD.  States that his partner recently told him that she tested positive for syphilis.  Denies any other STD exposures.  He states he is asymptomatic.  Denies any lesions to the penis or in the genital area. Denies fevers, abd pain, nv. No dysuria, or frequency. No penile discharge. No testicular pain/swelling/redness. Has mild itching to the genital area that is not constant.   History reviewed. No pertinent past medical history.  Patient Active Problem List   Diagnosis Date Noted   Possible exposure to STD 04/25/2016   Otitis, externa, infective 01/09/2014   Upper respiratory infection 10/23/2012   RIB PAIN 06/28/2009   OVERWEIGHT 05/12/2009    History reviewed. No pertinent surgical history.      Home Medications    Prior to Admission medications   Medication Sig Start Date End Date Taking? Authorizing Provider  fluticasone (FLONASE) 50 MCG/ACT nasal spray Place 2 sprays into the nose daily. 10/22/12   Barbaraann BarthelBreen, James O, MD  guaiFENesin (MUCINEX) 600 MG 12 hr tablet Take 2 tablets (1,200 mg total) by mouth 2 (two) times daily. 10/22/12   Barbaraann BarthelBreen, James O, MD  methocarbamol (ROBAXIN) 500 MG tablet Take 1 tablet (500 mg total) by mouth every 12 (twelve) hours as needed for muscle spasms. 11/13/18   Antony MaduraHumes, Kelly, PA-C  naproxen (NAPROSYN) 500 MG tablet Take 1 tablet (500 mg total) by mouth every 12 (twelve) hours as needed for mild pain or moderate pain. 11/13/18   Antony MaduraHumes, Kelly, PA-C  permethrin (ELIMITE) 5 % cream Apply 5% cream to entire body from the neck down, then wash off after 8 to 14 hours 02/14/19   Izzy Doubek S, PA-C  sodium  chloride (OCEAN) 0.65 % nasal spray Place 1 spray into the nose as needed for congestion. 10/22/12   Barbaraann BarthelBreen, James O, MD    Family History No family history on file.  Social History Social History   Tobacco Use   Smoking status: Never Smoker   Smokeless tobacco: Never Used  Substance Use Topics   Alcohol use: No   Drug use: Not on file     Allergies   Patient has no known allergies.   Review of Systems Review of Systems  Constitutional: Negative for fever.  Respiratory: Negative for shortness of breath.   Cardiovascular: Negative for chest pain.  Gastrointestinal: Negative for abdominal pain and diarrhea.  Genitourinary: Negative for discharge, dysuria, frequency, genital sores, penile pain, penile swelling, scrotal swelling, testicular pain and urgency.  Musculoskeletal: Negative for back pain.  Skin: Negative for rash.  Neurological: Negative for headaches.     Physical Exam Updated Vital Signs BP 111/74    Pulse 82    Temp 98 F (36.7 C) (Oral)    Resp 18    SpO2 99%   Physical Exam Vitals signs and nursing note reviewed.  Constitutional:      Appearance: He is well-developed.  HENT:     Head: Normocephalic and atraumatic.  Eyes:     Conjunctiva/sclera: Conjunctivae normal.  Neck:     Musculoskeletal: Neck supple.  Cardiovascular:     Rate and Rhythm: Normal rate and regular rhythm.  Pulmonary:     Effort: Pulmonary effort is normal.     Breath sounds: Normal breath sounds.  Abdominal:     General: Bowel sounds are normal.     Palpations: Abdomen is soft.     Tenderness: There is no abdominal tenderness.  Genitourinary:    Comments: Chaperone present.  Normal uncircumcised penis.  No evidence of lesions.  No erythema, swelling or tenderness to the testes. Skin:    General: Skin is warm and dry.  Neurological:     Mental Status: He is alert.      ED Treatments / Results  Labs (all labs ordered are listed, but only abnormal results are  displayed) Labs Reviewed  URINALYSIS, ROUTINE W REFLEX MICROSCOPIC  RAPID HIV SCREEN (HIV 1/2 AB+AG)  RPR  GC/CHLAMYDIA PROBE AMP ([place]) NOT AT [facility]    EKG None  Radiology No results found.  Procedures Procedures (including critical care time)  Medications Ordered in ED Medications  cefTRIAXone (ROCEPHIN) injection 250 mg (250 mg Intramuscular Given 02/14/19 0303)  azithromycin (ZITHROMAX) powder 1 g (1 g Oral Given 02/14/19 0308)  penicillin g benzathine (BICILLIN LA) 1200000 UNIT/2ML injection 2.4 Million Units (2.4 Million Units Intramuscular Given 02/14/19 0306)  ondansetron (ZOFRAN-ODT) disintegrating tablet 4 mg (4 mg Oral Given 02/14/19 0309)  lidocaine (PF) (XYLOCAINE) 1 % injection (2.1 mLs  Given 02/14/19 0305)     Initial Impression / Assessment and Plan / ED Course  I have reviewed the triage vital signs and the nursing notes.  Pertinent labs & imaging results that were available during my care of the patient were reviewed by me and considered in my medical decision making (see chart for details).      Final Clinical Impressions(s) / ED Diagnoses   Final diagnoses:  STD exposure  Itching of male genitalia   Patient presenting for evaluation after exposure to his partner who tested positive for syphilis.  He is currently asymptomatic at this time other than some mild itching to the genital area.   GU exam is WNL.   Testing for gc/chlamydia, hiv and rpr completed. tx prophilactically with PCN, azithro and ceftriaxone. Will give permethrin cream for itching. ua nonconcerning for uti.   Advised on safe sexual practices. Advised on pcp f/u and to return if worse. He voices understanding of the plan and reasons to return. All questions answered pt stable for d/c.  ED Discharge Orders         Ordered    permethrin (ELIMITE) 5 % cream     02/14/19 0325           Karrie Meres, PA-C 02/14/19 0353    Nira Conn, MD 02/14/19 [phone]

## 2019-08-05 ENCOUNTER — Encounter (HOSPITAL_COMMUNITY): Payer: Self-pay

## 2019-08-05 ENCOUNTER — Ambulatory Visit (HOSPITAL_COMMUNITY)
Admission: EM | Admit: 2019-08-05 | Discharge: 2019-08-05 | Disposition: A | Discharge status: Home / Self Care | Payer: Medicaid Other | Attending: Family Medicine | Admitting: Family Medicine

## 2019-08-05 ENCOUNTER — Other Ambulatory Visit: Payer: Self-pay

## 2019-08-05 DIAGNOSIS — Z113 Encounter for screening for infections with a predominantly sexual mode of transmission: Secondary | ICD-10-CM

## 2019-08-05 DIAGNOSIS — Z0189 Encounter for other specified special examinations: Secondary | ICD-10-CM | POA: Insufficient documentation

## 2019-08-05 NOTE — ED Provider Notes (Signed)
[facility]    CSN: [id number] Arrival date & time: 08/05/19  1841      History   Chief Complaint Chief Complaint  Patient presents with  . Requested STD TEST    HPI Henry Blanchard is a 23 y.o. male.   Patient presents with request for STD testing.  He denies symptoms or exposure; he states he is "being cautious."  He denies lesions, rash, penile discharge, testicular pain, abdominal pain, dysuria, back pain, or other symptoms.  No significant medical history.  The history is provided by the patient.    History reviewed. No pertinent past medical history.  Patient Active Problem List   Diagnosis Date Noted  . Possible exposure to STD 04/25/2016  . Otitis, externa, infective 01/09/2014  . Upper respiratory infection 10/23/2012  . RIB PAIN 06/28/2009  . OVERWEIGHT 05/12/2009    History reviewed. No pertinent surgical history.     Home Medications    Prior to Admission medications   Medication Sig Start Date End Date Taking? Authorizing Provider  fluticasone (FLONASE) 50 MCG/ACT nasal spray Place 2 sprays into the nose daily. 10/22/12   Barbaraann Barthel, MD  guaiFENesin (MUCINEX) 600 MG 12 hr tablet Take 2 tablets (1,200 mg total) by mouth 2 (two) times daily. 10/22/12   Barbaraann Barthel, MD  methocarbamol (ROBAXIN) 500 MG tablet Take 1 tablet (500 mg total) by mouth every 12 (twelve) hours as needed for muscle spasms. 11/13/18   Antony Madura, PA-C  naproxen (NAPROSYN) 500 MG tablet Take 1 tablet (500 mg total) by mouth every 12 (twelve) hours as needed for mild pain or moderate pain. 11/13/18   Antony Madura, PA-C  permethrin (ELIMITE) 5 % cream Apply 5% cream to entire body from the neck down, then wash off after 8 to 14 hours 02/14/19   Couture, Cortni S, PA-C  sodium chloride (OCEAN) 0.65 % nasal spray Place 1 spray into the nose as needed for congestion. 10/22/12   Barbaraann Barthel, MD    Family History Family History  Problem Relation Age of Onset  . Healthy  Mother   . Healthy Father     Social History Social History   Tobacco Use  . Smoking status: Never Smoker  . Smokeless tobacco: Never Used  Substance Use Topics  . Alcohol use: No  . Drug use: Not on file     Allergies   Patient has no known allergies.   Review of Systems Review of Systems  Constitutional: Negative for chills and fever.  HENT: Negative for ear pain and sore throat.   Eyes: Negative for pain and visual disturbance.  Respiratory: Negative for cough and shortness of breath.   Cardiovascular: Negative for chest pain and palpitations.  Gastrointestinal: Negative for abdominal pain and vomiting.  Genitourinary: Negative for discharge, dysuria, flank pain, hematuria and testicular pain.  Musculoskeletal: Negative for arthralgias and back pain.  Skin: Negative for color change and rash.  Neurological: Negative for seizures and syncope.  All other systems reviewed and are negative.    Physical Exam Triage Vital Signs ED Triage Vitals  Enc Vitals Group     BP 08/05/19 1938 123/66     Pulse Rate 08/05/19 1938 97     Resp 08/05/19 1938 16     Temp 08/05/19 1938 98.2 F (36.8 C)     Temp Source 08/05/19 1938 Tympanic     SpO2 08/05/19 1938 100 %     Weight --  Height --      Head Circumference --      Peak Flow --      Pain Score 08/05/19 1940 0     Pain Loc --      Pain Edu? --      Excl. in [place]? --    No data found.  Updated Vital Signs BP 123/66 (BP Location: Right Arm)   Pulse 97   Temp 98.2 F (36.8 C) (Tympanic)   Resp 16   SpO2 100%   Visual Acuity Right Eye Distance:   Left Eye Distance:   Bilateral Distance:    Right Eye Near:   Left Eye Near:    Bilateral Near:     Physical Exam Vitals signs and nursing note reviewed.  Constitutional:      Appearance: He is well-developed.  HENT:     Head: Normocephalic and atraumatic.     Mouth/Throat:     Mouth: Mucous membranes are moist.     Pharynx: Oropharynx is clear.  Eyes:      Conjunctiva/sclera: Conjunctivae normal.  Neck:     Musculoskeletal: Neck supple.  Cardiovascular:     Rate and Rhythm: Normal rate and regular rhythm.     Heart sounds: No murmur.  Pulmonary:     Effort: Pulmonary effort is normal. No respiratory distress.     Breath sounds: Normal breath sounds.  Abdominal:     General: Bowel sounds are normal.     Palpations: Abdomen is soft.     Tenderness: There is no abdominal tenderness. There is no right CVA tenderness, left CVA tenderness, guarding or rebound.  Genitourinary:    Penis: Normal.      Scrotum/Testes: Normal.  Skin:    General: Skin is warm and dry.     Findings: No rash.  Neurological:     Mental Status: He is alert.      UC Treatments / Results  Labs (all labs ordered are listed, but only abnormal results are displayed) Labs Reviewed - No data to display  EKG   Radiology No results found.  Procedures Procedures (including critical care time)  Medications Ordered in UC Medications - No data to display  Initial Impression / Assessment and Plan / UC Course  I have reviewed the triage vital signs and the nursing notes.  Pertinent labs & imaging results that were available during my care of the patient were reviewed by me and considered in my medical decision making (see chart for details).     Patient request for STD testing.  Urethral swab obtained.  Discussed with patient that we will call him if his test results are positive.  Instructed patient not to have sex until the test results are back.  Discussed that if his test results are positive, he and his partners will need treatment at that time.  Patient agrees to plan of care.   Final Clinical Impressions(s) / UC Diagnoses   Final diagnoses:  Patient request for diagnostic testing     Discharge Instructions     Your STD tests are pending.  If your test results are positive, we will call you.  Do not have sex until your test results are back.  If  your results are positive, you and your sexual partners may need treatment at that time.         ED Prescriptions    None     PDMP not reviewed this encounter.   Sharion Balloon, NP 08/05/19  2024  

## 2019-08-05 NOTE — Discharge Instructions (Signed)
Your STD tests are pending.  If your test results are positive, we will call you.  Do not have sex until your test results are back.  If your results are positive, you and your sexual partners may need treatment at that time.

## 2019-08-05 NOTE — ED Triage Notes (Signed)
Pt present to UC stating he would like an STD test. Pt denies any symptoms. Pt states he is unsure if partner has an STD, but they had oral sex.

## 2019-08-07 LAB — CYTOLOGY, (ORAL, ANAL, URETHRAL) ANCILLARY ONLY
Chlamydia: NEGATIVE
Neisseria Gonorrhea: NEGATIVE
Trichomonas: NEGATIVE

## 2019-08-20 ENCOUNTER — Ambulatory Visit: Payer: Medicaid Other | Admitting: Family Medicine

## 2019-08-27 ENCOUNTER — Ambulatory Visit: Payer: Medicaid Other | Admitting: Family Medicine

## 2019-09-23 ENCOUNTER — Encounter: Payer: Self-pay | Admitting: Emergency Medicine

## 2019-09-23 ENCOUNTER — Ambulatory Visit
Admission: EM | Admit: 2019-09-23 | Discharge: 2019-09-23 | Disposition: A | Discharge status: Home / Self Care | Payer: HRSA Program | Attending: Emergency Medicine | Admitting: Emergency Medicine

## 2019-09-23 ENCOUNTER — Other Ambulatory Visit: Payer: Self-pay

## 2019-09-23 DIAGNOSIS — Z20828 Contact with and (suspected) exposure to other viral communicable diseases: Secondary | ICD-10-CM

## 2019-09-23 DIAGNOSIS — R05 Cough: Secondary | ICD-10-CM | POA: Diagnosis not present

## 2019-09-23 DIAGNOSIS — R059 Cough, unspecified: Secondary | ICD-10-CM

## 2019-09-23 DIAGNOSIS — Z20822 Contact with and (suspected) exposure to covid-19: Secondary | ICD-10-CM

## 2019-09-23 DIAGNOSIS — F17200 Nicotine dependence, unspecified, uncomplicated: Secondary | ICD-10-CM

## 2019-09-23 MED ORDER — BENZONATATE 100 MG PO CAPS: 100.0000 mg | ORAL_CAPSULE | Freq: Three times a day (TID) | ORAL | 0 refills | Status: DC

## 2019-09-23 MED ORDER — LORATADINE 10 MG PO TABS: 10.0000 mg | ORAL_TABLET | Freq: Every day | ORAL | 0 refills | Status: DC

## 2019-09-23 MED ORDER — FLUTICASONE PROPIONATE 50 MCG/ACT NA SUSP: 2.0000 | Freq: Every day | NASAL | 6 refills | Status: DC

## 2019-09-23 MED ORDER — GUAIFENESIN ER 600 MG PO TB12: 1200.0000 mg | ORAL_TABLET | Freq: Two times a day (BID) | ORAL | 4 refills | Status: DC

## 2019-09-23 NOTE — ED Provider Notes (Signed)
[facility]    CSN: [id number] Arrival date & time: 09/23/19  1545      History   Chief Complaint Chief Complaint  Patient presents with  . Cough    HPI Henry Blanchard is a 23 y.o. male with history of tobacco and marijuana use, obesity presenting for Covid testing per employer's request.  Patient states that he has a chronic cough that has been ongoing since November without wheezing, shortness of breath, chest pain.  Patient does endorse history of allergies, for which he is not taking any medications.  States that sometimes he will get occasional brown sputum without blood: Denies increased frequency, sputum production from baseline.  Patient has not tried anything for symptoms as he "does not like to take any medications ".   History reviewed. No pertinent past medical history.  Patient Active Problem List   Diagnosis Date Noted  . Possible exposure to STD 04/25/2016  . Otitis, externa, infective 01/09/2014  . Upper respiratory infection 10/23/2012  . RIB PAIN 06/28/2009  . OVERWEIGHT 05/12/2009    History reviewed. No pertinent surgical history.     Home Medications    Prior to Admission medications   Medication Sig Start Date End Date Taking? Authorizing Provider  benzonatate (TESSALON) 100 MG capsule Take 1 capsule (100 mg total) by mouth every 8 (eight) hours. 09/23/19   Hall-Potvin, [place], PA-C  fluticasone (FLONASE) 50 MCG/ACT nasal spray Place 2 sprays into both nostrils daily. 09/23/19   Hall-Potvin, [place], PA-C  guaiFENesin (MUCINEX) 600 MG 12 hr tablet Take 2 tablets (1,200 mg total) by mouth 2 (two) times daily. 09/23/19   Hall-Potvin, [place], PA-C  loratadine (CLARITIN) 10 MG tablet Take 1 tablet (10 mg total) by mouth daily. 09/23/19   Hall-Potvin, [place], PA-C  sodium chloride (OCEAN) 0.65 % nasal spray Place 1 spray into the nose as needed for congestion. 10/22/12 09/23/19  Willeen Niece, MD    Family History Family History   Problem Relation Age of Onset  . Healthy Mother   . Healthy Father     Social History Social History   Tobacco Use  . Smoking status: Current Every Day Smoker    Packs/day: 3.00    Types: Cigars  . Smokeless tobacco: Never Used  Substance Use Topics  . Alcohol use: No  . Drug use: Yes    Types: Marijuana     Allergies   Patient has no known allergies.   Review of Systems Review of Systems  Constitutional: Negative for fatigue and fever.  Respiratory: Positive for cough. Negative for shortness of breath and wheezing.   Cardiovascular: Negative for chest pain and palpitations.  Gastrointestinal: Negative for abdominal pain, diarrhea and vomiting.  Musculoskeletal: Negative for arthralgias and myalgias.  Skin: Negative for rash and wound.  Neurological: Negative for speech difficulty and headaches.  All other systems reviewed and are negative.    Physical Exam Triage Vital Signs ED Triage Vitals  Enc Vitals Group     BP      Pulse      Resp      Temp      Temp src      SpO2      Weight      Height      Head Circumference      Peak Flow      Pain Score      Pain Loc      Pain Edu?      Excl.  in [place]?    No data found.  Updated Vital Signs BP 131/81 (BP Location: Left Arm)   Pulse (!) 57   Temp 98.4 F (36.9 C) (Temporal)   Resp 18   SpO2 98%   Visual Acuity Right Eye Distance:   Left Eye Distance:   Bilateral Distance:    Right Eye Near:   Left Eye Near:    Bilateral Near:     Physical Exam Constitutional:      General: He is not in acute distress.    Appearance: He is obese. He is not ill-appearing or diaphoretic.  HENT:     Head: Normocephalic and atraumatic.     Mouth/Throat:     Mouth: Mucous membranes are moist.     Pharynx: Oropharynx is clear.  Eyes:     General: No scleral icterus.    Conjunctiva/sclera: Conjunctivae normal.     Pupils: Pupils are equal, round, and reactive to light.  Neck:     Comments: Trachea midline,  negative JVD Cardiovascular:     Rate and Rhythm: Normal rate and regular rhythm.     Heart sounds: No murmur. No friction rub. No gallop.   Pulmonary:     Effort: Pulmonary effort is normal. No respiratory distress.     Breath sounds: No stridor. No wheezing, rhonchi or rales.     Comments: Good air entry bilaterally without prolonged expiratory phase Chest:     Chest wall: No tenderness.  Musculoskeletal:        General: Normal range of motion.     Cervical back: No tenderness.     Right lower leg: No edema.     Left lower leg: No edema.  Lymphadenopathy:     Cervical: No cervical adenopathy.  Skin:    Capillary Refill: Capillary refill takes less than 2 seconds.     Coloration: Skin is not jaundiced or pale.     Findings: No rash.  Neurological:     General: No focal deficit present.     Mental Status: He is alert and oriented to person, place, and time.      UC Treatments / Results  Labs (all labs ordered are listed, but only abnormal results are displayed) Labs Reviewed  NOVEL CORONAVIRUS, NAA    EKG   Radiology No results found.  Procedures Procedures (including critical care time)  Medications Ordered in UC Medications - No data to display  Initial Impression / Assessment and Plan / UC Course  I have reviewed the triage vital signs and the nursing notes.  Pertinent labs & imaging results that were available during my care of the patient were reviewed by me and considered in my medical decision making (see chart for details).     Patient afebrile, nontoxic, with SpO2 98%.  Covid PCR pending.  Patient to quarantine until results are back.  We will continue supportive management as outlined below.  Return precautions discussed, patient verbalized understanding and is agreeable to plan. Final Clinical Impressions(s) / UC Diagnoses   Final diagnoses:  Cough  Encounter for laboratory testing for COVID-19 virus     Discharge Instructions     Your COVID  test is pending - it is important to quarantine / isolate at home until your results are back. If you test positive and would like further evaluation for persistent or worsening symptoms, you may schedule an E-visit or virtual (video) visit throughout the [facility] MyChart app or website.  PLEASE NOTE: If you develop  severe chest pain or shortness of breath please go to the ER or call 9-1-1 for further evaluation --> DO NOT schedule electronic or virtual visits for this. Please call our office for further guidance / recommendations as needed.    ED Prescriptions    Medication Sig Dispense Auth. Provider   fluticasone (FLONASE) 50 MCG/ACT nasal spray Place 2 sprays into both nostrils daily. 16 g Hall-Potvin, [place], PA-C   guaiFENesin (MUCINEX) 600 MG 12 hr tablet Take 2 tablets (1,200 mg total) by mouth 2 (two) times daily. 20 tablet Hall-Potvin, [place], PA-C   benzonatate (TESSALON) 100 MG capsule Take 1 capsule (100 mg total) by mouth every 8 (eight) hours. 21 capsule Hall-Potvin, [place], PA-C   loratadine (CLARITIN) 10 MG tablet Take 1 tablet (10 mg total) by mouth daily. 30 tablet Hall-Potvin, [place], PA-C     PDMP not reviewed this encounter.   Odette Fraction [place], [place] 09/23/19 1731

## 2019-09-23 NOTE — ED Triage Notes (Signed)
Pt presents to [facility] for assessment of cough with occasional brown mucous since the beginning of November.  Patient states it has just been getting worse the last week, meaning he couldn't suppress it at work anymore.

## 2019-09-23 NOTE — Discharge Instructions (Signed)
Your COVID test is pending - it is important to quarantine / isolate at home until your results are back. °If you test positive and would like further evaluation for persistent or worsening symptoms, you may schedule an E-visit or virtual (video) visit throughout the [facility] MyChart app or website. ° °PLEASE NOTE: If you develop severe chest pain or shortness of breath please go to the ER or call 9-1-1 for further evaluation --> DO NOT schedule electronic or virtual visits for this. °Please call our office for further guidance / recommendations as needed. °

## 2019-09-24 LAB — NOVEL CORONAVIRUS, NAA: SARS-CoV-2, NAA: NOT DETECTED

## 2020-01-01 ENCOUNTER — Ambulatory Visit
Admission: EM | Admit: 2020-01-01 | Discharge: 2020-01-01 | Disposition: A | Discharge status: Home / Self Care | Payer: Medicaid Other | Attending: Physician Assistant | Admitting: Physician Assistant

## 2020-01-01 ENCOUNTER — Other Ambulatory Visit: Payer: Self-pay

## 2020-01-01 DIAGNOSIS — F17210 Nicotine dependence, cigarettes, uncomplicated: Secondary | ICD-10-CM

## 2020-01-01 DIAGNOSIS — R079 Chest pain, unspecified: Secondary | ICD-10-CM

## 2020-01-01 HISTORY — DX: Allergy, unspecified, initial encounter: T78.40XA

## 2020-01-01 MED ORDER — METHOCARBAMOL 500 MG PO TABS: 500.0000 mg | ORAL_TABLET | Freq: Every evening | ORAL | 0 refills | Status: DC | PRN

## 2020-01-01 MED ORDER — MELOXICAM 7.5 MG PO TABS: 7.5000 mg | ORAL_TABLET | Freq: Every day | ORAL | 0 refills | Status: DC

## 2020-01-01 MED ORDER — FAMOTIDINE 20 MG PO TABS: 20.0000 mg | ORAL_TABLET | Freq: Two times a day (BID) | ORAL | 0 refills | Status: AC

## 2020-01-01 NOTE — ED Triage Notes (Signed)
Pt c/o rt sided chest pain x2-3 wks. States worse on movement. States stretching helps at times.

## 2020-01-01 NOTE — ED Provider Notes (Signed)
[facility]    CSN: [id number] Arrival date & time: 01/01/20  1718      History   Chief Complaint Chief Complaint  Patient presents with  . Chest Pain    HPI Henry Blanchard is a 25 y.o. male.   24 year old male comes in for 2 to 3-week history of right-sided chest pain.  Pain is intermittent, sharp, worse at night with laying down.  Denies injury/trauma.  Denies shortness of breath, palpitation.  Denies abdominal pain, nausea, vomiting.  Does have bloating sensation with increased burping.  States diet mostly consists of fast food.  Denies exertional fatigue, exertional chest pain, dyspnea on exertion.  Denies personal or family history of heart disease.     Past Medical History:  Diagnosis Date  . Allergies     Patient Active Problem List   Diagnosis Date Noted  . Possible exposure to STD 04/25/2016  . Otitis, externa, infective 01/09/2014  . Upper respiratory infection 10/23/2012  . RIB PAIN 06/28/2009  . OVERWEIGHT 05/12/2009    History reviewed. No pertinent surgical history.     Home Medications    Prior to Admission medications   Medication Sig Start Date End Date Taking? Authorizing Provider  famotidine (PEPCID) 20 MG tablet Take 1 tablet (20 mg total) by mouth 2 (two) times daily. 01/01/20   Tasia Catchings, Jaydin Boniface V, PA-C  meloxicam (MOBIC) 7.5 MG tablet Take 1 tablet (7.5 mg total) by mouth daily. 01/01/20   Tasia Catchings, Iyanah Demont V, PA-C  methocarbamol (ROBAXIN) 500 MG tablet Take 1 tablet (500 mg total) by mouth at bedtime as needed for muscle spasms. 01/01/20   Tasia Catchings, Trevel Dillenbeck V, PA-C  sodium chloride (OCEAN) 0.65 % nasal spray Place 1 spray into the nose as needed for congestion. 10/22/12 09/23/19  Willeen Niece, MD    Family History Family History  Problem Relation Age of Onset  . Healthy Mother   . Healthy Father     Social History Social History   Tobacco Use  . Smoking status: Current Every Day Smoker    Packs/day: 3.00    Types: Cigars  . Smokeless tobacco:  Never Used  Substance Use Topics  . Alcohol use: No  . Drug use: Yes    Types: Marijuana     Allergies   Patient has no known allergies.   Review of Systems Review of Systems  Reason unable to perform ROS: See HPI as above.     Physical Exam Triage Vital Signs ED Triage Vitals  Enc Vitals Group     BP 01/01/20 1736 116/70     Pulse Rate 01/01/20 1736 80     Resp 01/01/20 1736 16     Temp 01/01/20 1736 98.3 F (36.8 C)     Temp Source 01/01/20 1736 Oral     SpO2 01/01/20 1736 96 %     Weight --      Height --      Head Circumference --      Peak Flow --      Pain Score 01/01/20 1747 1     Pain Loc --      Pain Edu? --      Excl. in [place]? --    No data found.  Updated Vital Signs BP 116/70 (BP Location: Left Arm)   Pulse 80   Temp 98.3 F (36.8 C) (Oral)   Resp 16   SpO2 96%   Physical Exam Constitutional:      General: He is  not in acute distress.    Appearance: Normal appearance. He is not ill-appearing, toxic-appearing or diaphoretic.  HENT:     Head: Normocephalic and atraumatic.  Cardiovascular:     Rate and Rhythm: Normal rate and regular rhythm.  Pulmonary:     Effort: Pulmonary effort is normal. No respiratory distress.     Comments: LCTAB Chest:     Chest wall: No mass, deformity or tenderness.  Abdominal:     General: Bowel sounds are normal.     Palpations: Abdomen is soft.     Tenderness: There is no abdominal tenderness. There is no right CVA tenderness, left CVA tenderness, guarding or rebound.  Musculoskeletal:     Cervical back: Normal range of motion and neck supple.  Skin:    General: Skin is warm and dry.  Neurological:     Mental Status: He is alert and oriented to person, place, and time.      UC Treatments / Results  Labs (all labs ordered are listed, but only abnormal results are displayed) Labs Reviewed - No data to display  EKG   Radiology No results found.  Procedures Procedures (including critical care  time)  Medications Ordered in UC Medications - No data to display  Initial Impression / Assessment and Plan / UC Course  I have reviewed the triage vital signs and the nursing notes.  Pertinent labs & imaging results that were available during my care of the patient were reviewed by me and considered in my medical decision making (see chart for details).    ?chest wall pain vs GERD vs gall bladder disease vs PE.  Lower suspicion for PE.  Patient without tachycardia, tachypnea, hypoxia, no shortness of breath.  PERC rule out.  Lower suspicion for gallbladder at this time as pain is not specifically associated with oral intake/fatty foods.  Will treat for possible chest wall pain with Mobic and muscle relaxers for now.  Discussed to take Pepcid for acid reflux.  Patient to follow-up with PCP if symptoms do not improving.  Return precautions given.  Patient expresses understanding and agrees to plan.  Final Clinical Impressions(s) / UC Diagnoses   Final diagnoses:  Right-sided chest pain    ED Prescriptions    Medication Sig Dispense Auth. Provider   meloxicam (MOBIC) 7.5 MG tablet Take 1 tablet (7.5 mg total) by mouth daily. 7 tablet Torri Michalski V, PA-C   methocarbamol (ROBAXIN) 500 MG tablet Take 1 tablet (500 mg total) by mouth at bedtime as needed for muscle spasms. 7 tablet Lela Gell V, PA-C   famotidine (PEPCID) 20 MG tablet Take 1 tablet (20 mg total) by mouth 2 (two) times daily. 30 tablet Belinda Fisher, PA-C     PDMP not reviewed this encounter.   Belinda Fisher, PA-C 01/01/20 1850

## 2020-01-01 NOTE — Discharge Instructions (Signed)
Start Mobic. Do not take ibuprofen (motrin/advil)/ naproxen (aleve) while on mobic. Robaxin as needed, this can make you drowsy, so do not take if you are going to drive, operate heavy machinery, or make important decisions.  Start Pepcid for possible acid reflux symptoms.  As discussed, if symptoms occurs shortly after eating fatty foods, will need to follow-up with primary care to evaluate for possible gallstones causing symptoms.  If symptoms not improving, please follow-up with primary care for further evaluation.  Otherwise, if sudden worsening of chest pain, short of breath, nausea/vomiting, weak, dizzy, go to the emergency department for further evaluation.

## 2020-08-12 ENCOUNTER — Other Ambulatory Visit: Payer: Self-pay

## 2020-08-12 ENCOUNTER — Ambulatory Visit
Admission: EM | Admit: 2020-08-12 | Discharge: 2020-08-12 | Disposition: A | Discharge status: Home / Self Care | Payer: Medicaid Other | Attending: Emergency Medicine | Admitting: Emergency Medicine

## 2020-08-12 DIAGNOSIS — S61258A Open bite of other finger without damage to nail, initial encounter: Secondary | ICD-10-CM

## 2020-08-12 DIAGNOSIS — W540XXA Bitten by dog, initial encounter: Secondary | ICD-10-CM

## 2020-08-12 MED ORDER — TETANUS-DIPHTH-ACELL PERTUSSIS 5-2.5-18.5 LF-MCG/0.5 IM SUSY: 0.5000 mL | PREFILLED_SYRINGE | Freq: Once | INTRAMUSCULAR | Status: DC

## 2020-08-12 MED ORDER — AMOXICILLIN-POT CLAVULANATE 875-125 MG PO TABS: 1.0000 | ORAL_TABLET | Freq: Two times a day (BID) | ORAL | 0 refills | Status: AC

## 2020-08-12 NOTE — ED Provider Notes (Signed)
[facility]    CSN: [id number] Arrival date & time: 08/12/20  1553      History   Chief Complaint Chief Complaint  Patient presents with  . Animal Bite    occurred yesterday    HPI Henry Blanchard is a 24 y.o. male  Presenting for evaluation of wound to right index finger.  States he was breaking up his neighbors dog and his dog and was bitten.  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  Past Medical History:  Diagnosis Date  . Allergies     Patient Active Problem List   Diagnosis Date Noted  . Possible exposure to STD 04/25/2016  . Otitis, externa, infective 01/09/2014  . Upper respiratory infection 10/23/2012  . RIB PAIN 06/28/2009  . OVERWEIGHT 05/12/2009    History reviewed. No pertinent surgical history.     Home Medications    Prior to Admission medications   Medication Sig Start Date End Date Taking? Authorizing Provider  amoxicillin-clavulanate (AUGMENTIN) 875-125 MG tablet Take 1 tablet by mouth every 12 (twelve) hours for 5 days. 08/12/20 08/17/20  Hall-Potvin, [place], PA-C  famotidine (PEPCID) 20 MG tablet Take 1 tablet (20 mg total) by mouth 2 (two) times daily. 01/01/20   Cathie Hoops, Amy V, PA-C  sodium chloride (OCEAN) 0.65 % nasal spray Place 1 spray into the nose as needed for congestion. 10/22/12 09/23/19  Barbaraann Barthel, MD    Family History Family History  Problem Relation Age of Onset  . Healthy Mother   . Healthy Father     Social History Social History   Tobacco Use  . Smoking status: Current Every Day Smoker    Packs/day: 3.00    Types: Cigars  . Smokeless tobacco: Never Used  Vaping Use  . Vaping Use: Never used  Substance Use Topics  . Alcohol use: No  . Drug use: Yes    Types: Marijuana     Allergies   Patient has no known allergies.   Review of Systems Review of Systems  Constitutional: Negative for fatigue and fever.  Respiratory: Negative for cough  and shortness of breath.   Cardiovascular: Negative for chest pain and palpitations.  Gastrointestinal: Negative for abdominal pain, diarrhea and vomiting.  Musculoskeletal: Negative for arthralgias and myalgias.  Skin: Positive for wound. Negative for rash.  Neurological: Negative for speech difficulty, weakness and headaches.  All other systems reviewed and are negative.    Physical Exam Triage Vital Signs ED Triage Vitals  Enc Vitals Group     BP 08/12/20 1635 106/69     Pulse Rate 08/12/20 1635 88     Resp 08/12/20 1635 18     Temp 08/12/20 1635 98.3 F (36.8 C)     Temp Source 08/12/20 1635 Oral     SpO2 08/12/20 1635 97 %     Weight --      Height --      Head Circumference --      Peak Flow --      Pain Score 08/12/20 1637 2     Pain Loc --      Pain Edu? --      Excl. in [place]? --    No data found.  Updated Vital Signs BP 106/69 (BP Location: Left Arm)   Pulse 88   Temp 98.3 F (36.8 C) (Oral)   Resp 18   SpO2 97%   Visual Acuity Right Eye Distance:  Left Eye Distance:   Bilateral Distance:    Right Eye Near:   Left Eye Near:    Bilateral Near:     Physical Exam Constitutional:      General: He is not in acute distress. HENT:     Head: Normocephalic and atraumatic.  Eyes:     General: No scleral icterus.    Pupils: Pupils are equal, round, and reactive to light.  Cardiovascular:     Rate and Rhythm: Normal rate.  Pulmonary:     Effort: Pulmonary effort is normal. No respiratory distress.     Breath sounds: No wheezing.  Musculoskeletal:        General: Tenderness present. No swelling. Normal range of motion.     Comments: Right index finger with DIP tenderness, punctate wounds that are closed.  NVI  Skin:    Coloration: Skin is not jaundiced or pale.     Findings: Erythema present.  Neurological:     Mental Status: He is alert and oriented to person, place, and time.      UC Treatments / Results  Labs (all labs ordered are listed, but  only abnormal results are displayed) Labs Reviewed - No data to display  EKG   Radiology No results found.  Procedures Procedures (including critical care time)  Medications Ordered in UC Medications - No data to display  Initial Impression / Assessment and Plan / UC Course  I have reviewed the triage vital signs and the nursing notes.  Pertinent labs & imaging results that were available during my care of the patient were reviewed by me and considered in my medical decision making (see chart for details).     Patient working with animal control regarding dog's rabies status.  Discussed patient go to ER if unable to obtain these records, or there was rabies exposure.  Will cover for bacterial infection with Augmentin.  Pt declined XR.  Return precautions discussed, pt verbalized understanding and is agreeable to plan. Final Clinical Impressions(s) / UC Diagnoses   Final diagnoses:  Dog bite of index finger, initial encounter     Discharge Instructions     Go to ER if any concern for dog having rabies!    ED Prescriptions    Medication Sig Dispense Auth. Provider   amoxicillin-clavulanate (AUGMENTIN) 875-125 MG tablet Take 1 tablet by mouth every 12 (twelve) hours for 5 days. 10 tablet Hall-Potvin, [place], PA-C     PDMP not reviewed this encounter.   Hall-Potvin, [place], [place] 08/12/20 1650

## 2020-08-12 NOTE — Discharge Instructions (Signed)
Go to ER if any concern for dog having rabies!

## 2020-08-12 NOTE — ED Triage Notes (Signed)
PT states he tried to break up the neighbor's dog and his dog and was bitten on the right pointer finger. PT is aox4 and ambulatory.

## 2021-10-24 ENCOUNTER — Encounter (HOSPITAL_COMMUNITY): Payer: Self-pay

## 2021-10-24 ENCOUNTER — Ambulatory Visit (HOSPITAL_COMMUNITY)
Admission: EM | Admit: 2021-10-24 | Discharge: 2021-10-24 | Disposition: A | Discharge status: Home / Self Care | Payer: Medicaid Other | Attending: Physician Assistant | Admitting: Physician Assistant

## 2021-10-24 ENCOUNTER — Other Ambulatory Visit: Payer: Self-pay

## 2021-10-24 DIAGNOSIS — L03119 Cellulitis of unspecified part of limb: Secondary | ICD-10-CM

## 2021-10-24 DIAGNOSIS — L02214 Cutaneous abscess of groin: Secondary | ICD-10-CM

## 2021-10-24 DIAGNOSIS — L02419 Cutaneous abscess of limb, unspecified: Secondary | ICD-10-CM

## 2021-10-24 DIAGNOSIS — L03314 Cellulitis of groin: Secondary | ICD-10-CM

## 2021-10-24 MED ORDER — LIDOCAINE-EPINEPHRINE 1 %-1:100000 IJ SOLN
INTRAMUSCULAR | Status: AC
  Filled 2021-10-24: qty 1

## 2021-10-24 NOTE — ED Triage Notes (Signed)
Pt presents with c/o R leg pain x 2 days.   Pt states the pain was random.

## 2021-10-24 NOTE — ED Provider Notes (Signed)
[facility]    CSN: [id number] Arrival date & time: 10/24/21  1853      History   Chief Complaint Chief Complaint  Patient presents with   Leg Pain    HPI Henry Blanchard is a 26 y.o. male.   Pt complains of a painful bump to his right inner thigh that he noticed two days ago.  Reports it has gotten bigger in size over the last few days.  He reports initially he thought it was an ingrown hair, but now it is very painful.  He denies fever, chills, or drainage from the area.  He has tried nothing for the sx.    Past Medical History:  Diagnosis Date   Allergies     Patient Active Problem List   Diagnosis Date Noted   Possible exposure to STD 04/25/2016   Otitis, externa, infective 01/09/2014   Upper respiratory infection 10/23/2012   RIB PAIN 06/28/2009   OVERWEIGHT 05/12/2009    History reviewed. No pertinent surgical history.     Home Medications    Prior to Admission medications   Medication Sig Start Date End Date Taking? Authorizing Provider  famotidine (PEPCID) 20 MG tablet Take 1 tablet (20 mg total) by mouth 2 (two) times daily. 01/01/20   Tasia Catchings, Amy V, PA-C  sodium chloride (OCEAN) 0.65 % nasal spray Place 1 spray into the nose as needed for congestion. 10/22/12 09/23/19  Willeen Niece, MD    Family History Family History  Problem Relation Age of Onset   Healthy Mother    Healthy Father     Social History Social History   Tobacco Use   Smoking status: Every Day    Packs/day: 3.00    Types: Cigars, Cigarettes   Smokeless tobacco: Never  Vaping Use   Vaping Use: Never used  Substance Use Topics   Alcohol use: No   Drug use: Yes    Types: Marijuana     Allergies   Patient has no known allergies.   Review of Systems Review of Systems  Constitutional:  Negative for chills and fever.  HENT:  Negative for ear pain and sore throat.   Eyes:  Negative for pain and visual disturbance.  Respiratory:  Negative for cough and shortness  of breath.   Cardiovascular:  Negative for chest pain and palpitations.  Gastrointestinal:  Negative for abdominal pain and vomiting.  Genitourinary:  Negative for dysuria and hematuria.  Musculoskeletal:  Negative for arthralgias and back pain.  Skin:  Positive for wound. Negative for color change and rash.  Neurological:  Negative for seizures and syncope.  All other systems reviewed and are negative.   Physical Exam Triage Vital Signs ED Triage Vitals  Enc Vitals Group     BP 10/24/21 1920 116/73     Pulse Rate 10/24/21 1920 80     Resp 10/24/21 1920 17     Temp 10/24/21 1920 97.9 F (36.6 C)     Temp Source 10/24/21 1920 Oral     SpO2 10/24/21 1920 98 %     Weight --      Height --      Head Circumference --      Peak Flow --      Pain Score 10/24/21 1919 5     Pain Loc --      Pain Edu? --      Excl. in [place]? --    No data found.  Updated Vital Signs BP 116/73 (  BP Location: Right Arm)    Pulse 80    Temp 97.9 F (36.6 C) (Oral)    Resp 17    SpO2 98%   Visual Acuity Right Eye Distance:   Left Eye Distance:   Bilateral Distance:    Right Eye Near:   Left Eye Near:    Bilateral Near:     Physical Exam Vitals and nursing note reviewed.  Constitutional:      General: He is not in acute distress.    Appearance: He is well-developed.  HENT:     Head: Normocephalic and atraumatic.  Eyes:     Conjunctiva/sclera: Conjunctivae normal.  Cardiovascular:     Rate and Rhythm: Normal rate and regular rhythm.     Heart sounds: No murmur heard. Pulmonary:     Effort: Pulmonary effort is normal. No respiratory distress.     Breath sounds: Normal breath sounds.  Abdominal:     Palpations: Abdomen is soft.     Tenderness: There is no abdominal tenderness.  Musculoskeletal:        General: No swelling.     Cervical back: Neck supple.     Comments: Right groin with 2 cm x 2 cm fluctuant abscess, no drainage noted.   Skin:    General: Skin is warm and dry.      Capillary Refill: Capillary refill takes less than 2 seconds.  Neurological:     Mental Status: He is alert.  Psychiatric:        Mood and Affect: Mood normal.     UC Treatments / Results  Labs (all labs ordered are listed, but only abnormal results are displayed) Labs Reviewed - No data to display  EKG   Radiology No results found.  Procedures Incision and Drainage  Date/Time: 10/24/2021 8:01 PM Performed by: Ward, Lenise Arena, PA-C Authorized by: Ward, Lenise Arena, PA-C   Consent:    Consent obtained:  Verbal   Consent given by:  Patient   Risks, benefits, and alternatives were discussed: yes     Risks discussed:  Bleeding, infection, pain and incomplete drainage   Alternatives discussed:  No treatment and alternative treatment Universal protocol:    Procedure explained and questions answered to patient or proxy's satisfaction: yes     Patient identity confirmed:  Verbally with patient Location:    Type:  Abscess   Location:  Lower extremity   Lower extremity location:  Leg   Leg location:  R upper leg Pre-procedure details:    Skin preparation:  Povidone-iodine Anesthesia:    Anesthesia method:  Local infiltration   Local anesthetic:  Lidocaine 2% WITH epi Procedure type:    Complexity:  Simple Procedure details:    Incision types:  Stab incision   Incision depth:  Dermal   Wound management:  Probed and deloculated and irrigated with saline   Drainage:  Purulent   Drainage amount:  Moderate   Wound treatment:  Wound left open   Packing materials:  None Post-procedure details:    Procedure completion:  Tolerated (including critical care time)  Medications Ordered in UC Medications - No data to display  Initial Impression / Assessment and Plan / UC Course  I have reviewed the triage vital signs and the nursing notes.  Pertinent labs & imaging results that were available during my care of the patient were reviewed by me and considered in my medical decision  making (see chart for details).     Abscess, successful I&D done  in clinic today with moderate amount of purulent drainage.  Advised warm compress, keep area clean and dry.  Return precautions discussed.  Final Clinical Impressions(s) / UC Diagnoses   Final diagnoses:  Cellulitis and abscess of leg     Discharge Instructions      Apply warm compress It is normal for wound to continue to drain over the next few days Can take ibuprofen or tylenol as needed for discomfort Return if not improving over the next few days Keep area clean    ED Prescriptions   None    PDMP not reviewed this encounter.   Ward, Lenise Arena, PA-C 10/24/21 2003

## 2021-10-24 NOTE — Discharge Instructions (Signed)
Apply warm compress It is normal for wound to continue to drain over the next few days Can take ibuprofen or tylenol as needed for discomfort Return if not improving over the next few days Keep area clean

## 2021-12-17 ENCOUNTER — Encounter (HOSPITAL_COMMUNITY): Payer: Self-pay | Admitting: Emergency Medicine

## 2021-12-17 ENCOUNTER — Ambulatory Visit (INDEPENDENT_AMBULATORY_CARE_PROVIDER_SITE_OTHER): Payer: Self-pay

## 2021-12-17 ENCOUNTER — Other Ambulatory Visit: Payer: Self-pay

## 2021-12-17 ENCOUNTER — Ambulatory Visit (HOSPITAL_COMMUNITY)
Admission: EM | Admit: 2021-12-17 | Discharge: 2021-12-17 | Disposition: A | Discharge status: Home / Self Care | Payer: Self-pay | Attending: Physician Assistant | Admitting: Physician Assistant

## 2021-12-17 DIAGNOSIS — S022XXA Fracture of nasal bones, initial encounter for closed fracture: Secondary | ICD-10-CM
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
## 2021-12-17 NOTE — Discharge Instructions (Signed)
Apply ice ?Take ibuprofen or tylenol as needed ? ?
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
## 2023-09-25 IMAGING — DX DG NASAL BONES 3+V
3 series · 3 of 3 positions shown · non-contrast
Comparison: None.

CLINICAL DATA: Trauma

EXAM:
NASAL BONES - 3+ VIEW

[[person_name]]
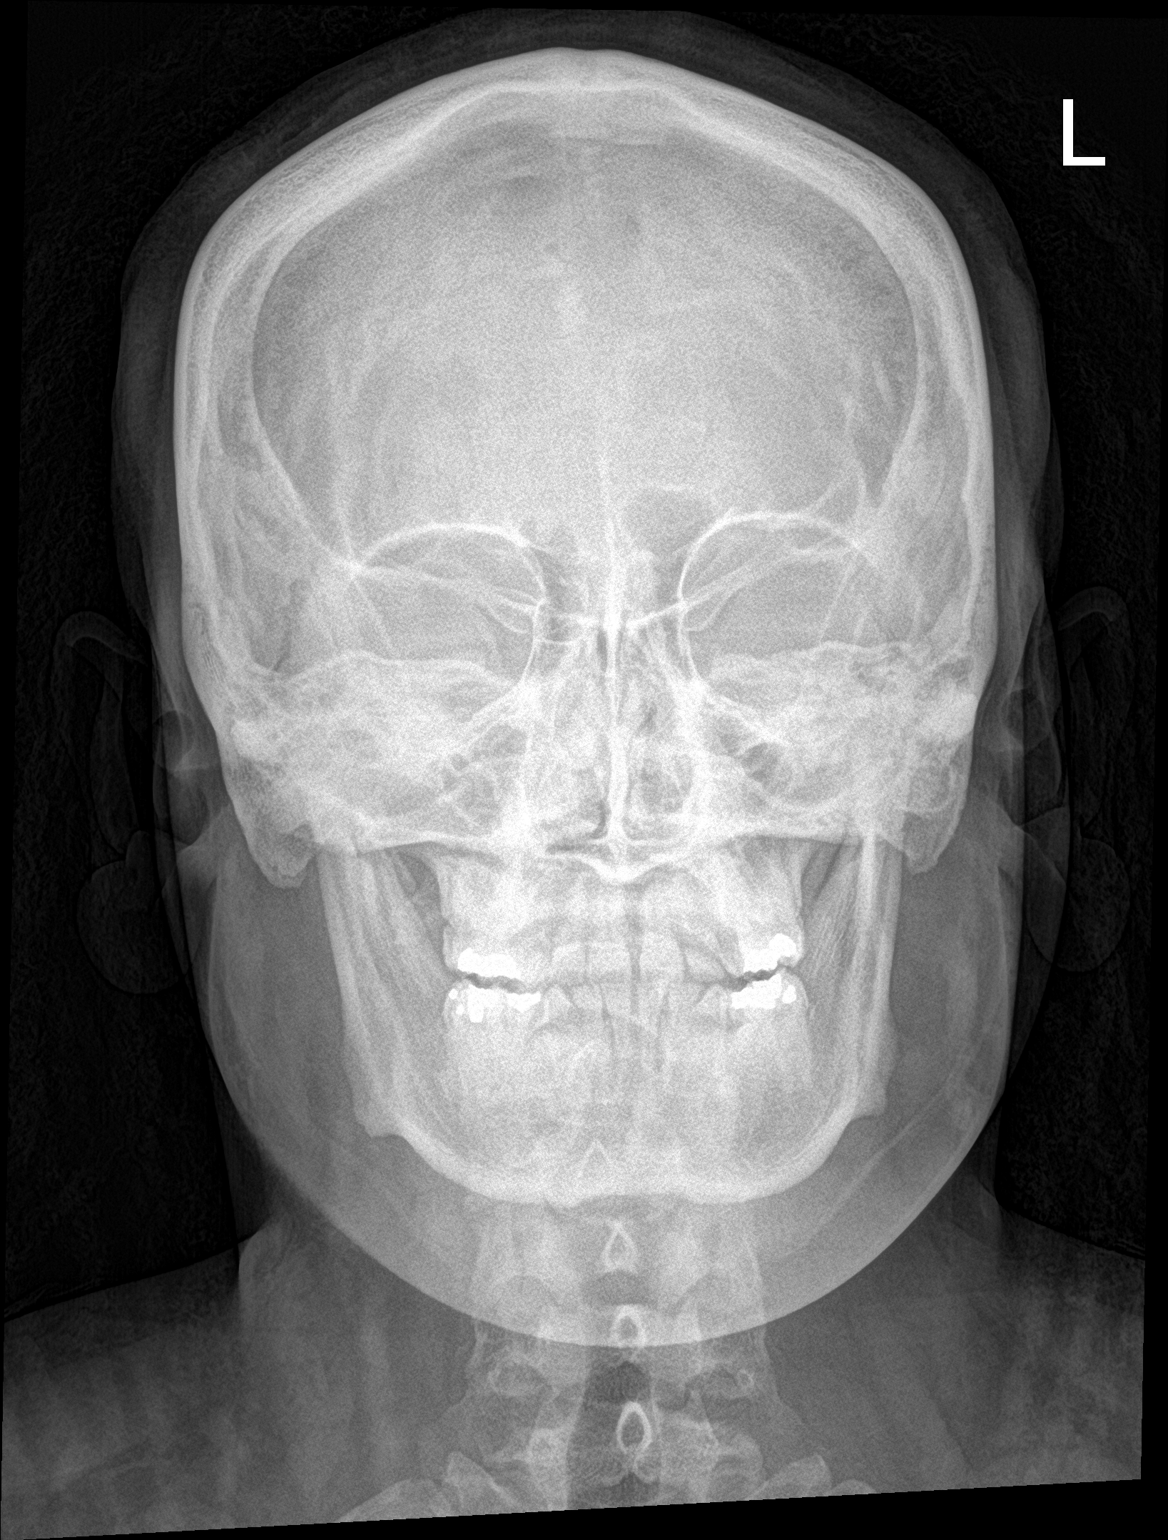

[nasal lat (1 of 2)]
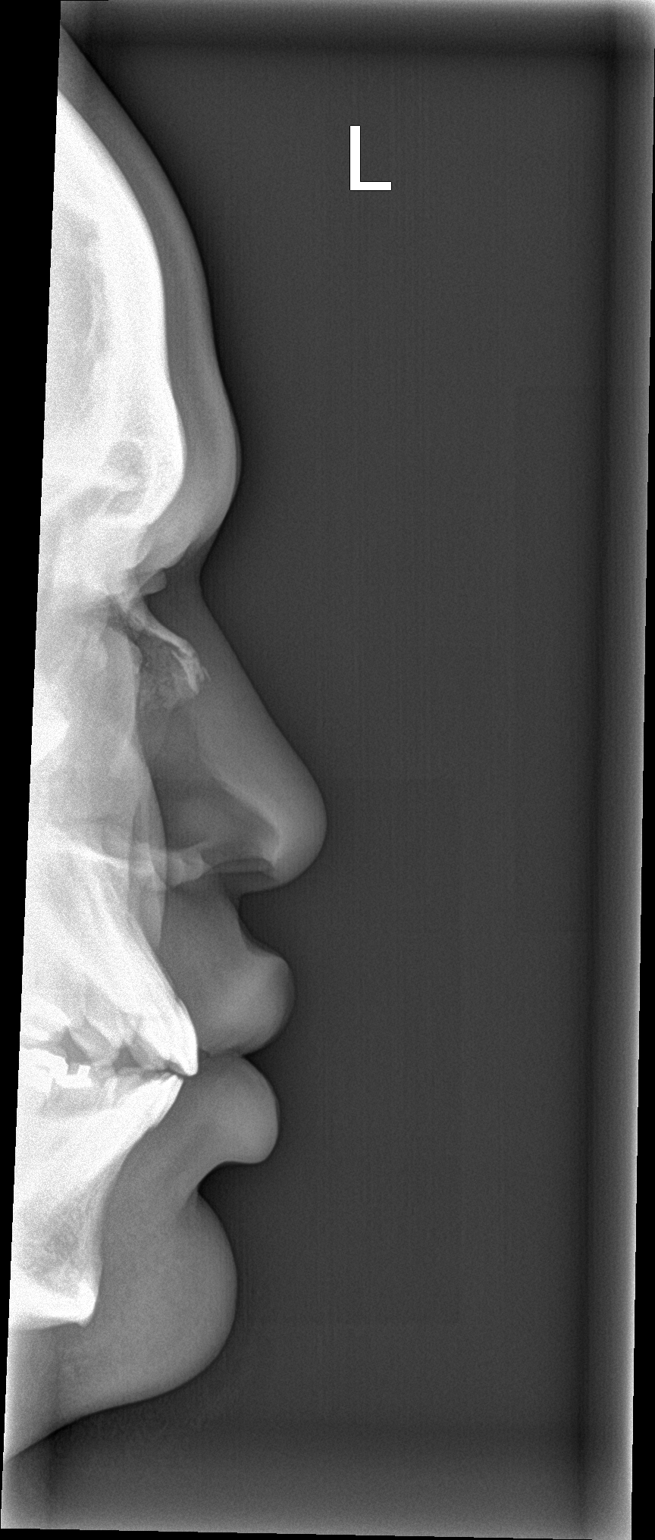

[nasal lat (2 of 2)]
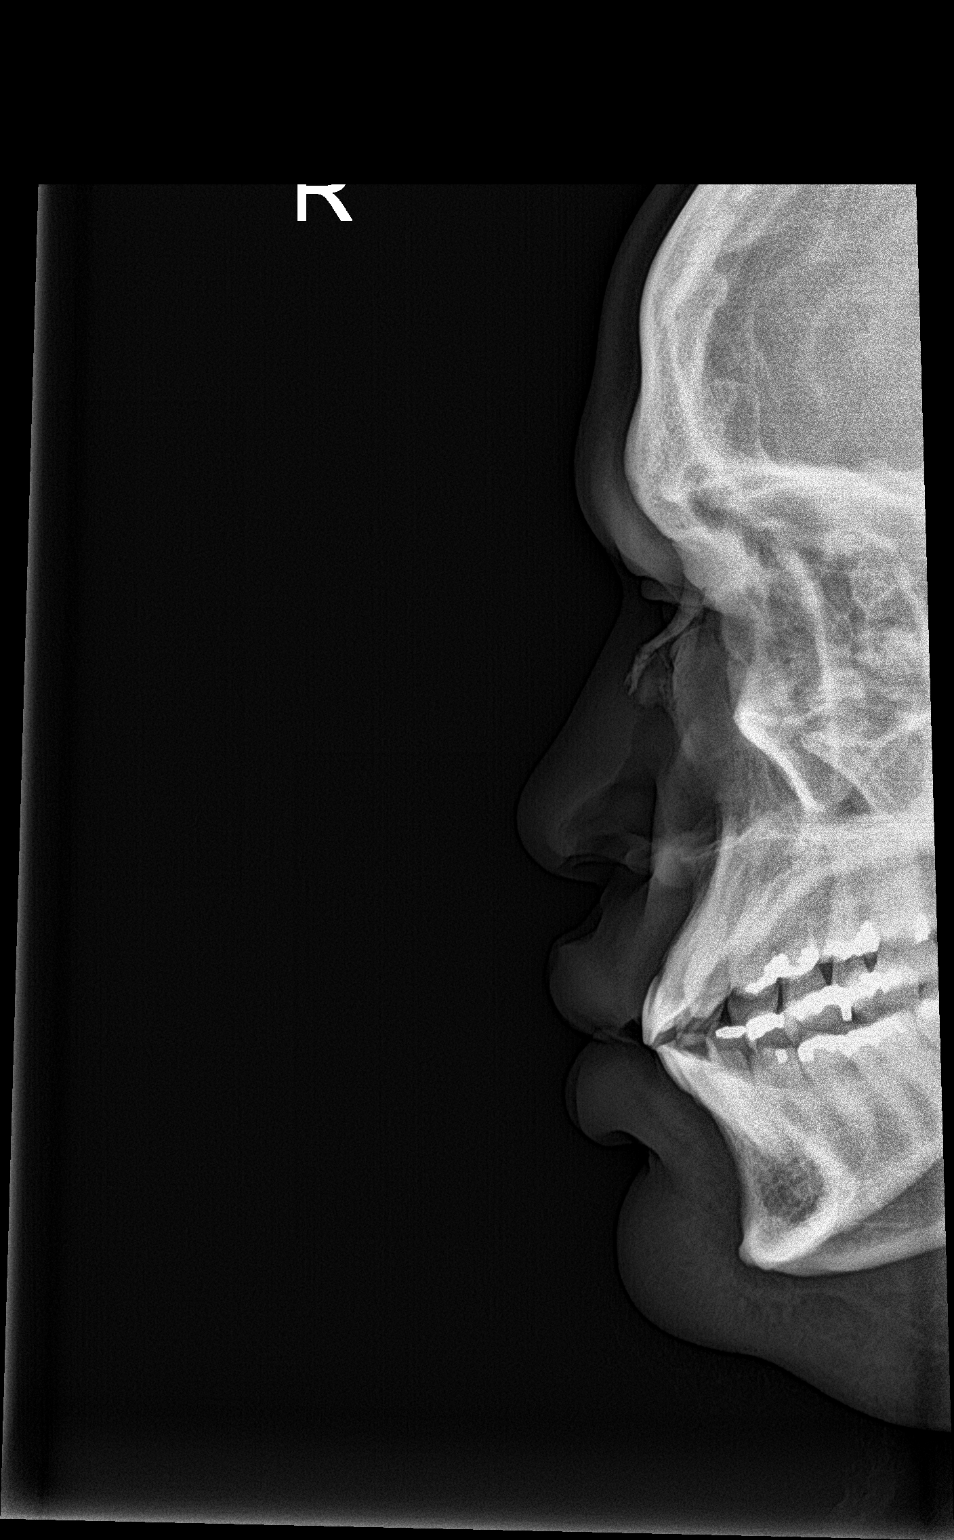

[3 of 3 positions shown; findings below may reference images not displayed]

FINDINGS: Comminuted fracture is noted near the tip of nasal bones. There is
anterior angulation at the fracture site. Anterior nasal spine of
maxilla appears intact.
IMPRESSION: Comminuted angulated fracture is noted in the nasal bones.
